# Patient Record
Sex: Female | Born: 1985 | Race: White | Hispanic: No | Marital: Married | Smoking: Never smoker
Health system: Southern US, Community
[De-identification: ages and names within clinical notes are randomized; demographics above are authoritative.]

## PROBLEM LIST (undated history)

## (undated) DIAGNOSIS — O139 Gestational [pregnancy-induced] hypertension without significant proteinuria, unspecified trimester: Secondary | ICD-10-CM

## (undated) DIAGNOSIS — O09299 Supervision of pregnancy with other poor reproductive or obstetric history, unspecified trimester: Secondary | ICD-10-CM

---
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
## 2004-03-16 ENCOUNTER — Other Ambulatory Visit: Admission: RE | Admit: 2004-03-16 | Discharge: 2004-03-16 | Payer: Self-pay | Admitting: Obstetrics and Gynecology

## 2005-03-21 ENCOUNTER — Other Ambulatory Visit: Admission: RE | Admit: 2005-03-21 | Discharge: 2005-03-21 | Payer: Self-pay | Admitting: Obstetrics and Gynecology

## 2005-10-10 HISTORY — PX: WISDOM TOOTH EXTRACTION: SHX21

## 2009-10-10 HISTORY — PX: KNEE SURGERY: SHX244

## 2010-06-16 ENCOUNTER — Ambulatory Visit (HOSPITAL_COMMUNITY)
Admission: RE | Admit: 2010-06-16 | Discharge: 2010-06-16 | Payer: Self-pay | Source: Home / Self Care | Admitting: Obstetrics and Gynecology

## 2010-09-17 ENCOUNTER — Inpatient Hospital Stay (HOSPITAL_COMMUNITY)
Admission: AD | Admit: 2010-09-17 | Discharge: 2010-09-17 | Payer: Self-pay | Source: Home / Self Care | Attending: Obstetrics & Gynecology | Admitting: Obstetrics & Gynecology

## 2010-09-18 ENCOUNTER — Ambulatory Visit (HOSPITAL_COMMUNITY)
Admission: RE | Admit: 2010-09-18 | Discharge: 2010-09-18 | Payer: Self-pay | Source: Home / Self Care | Attending: Obstetrics & Gynecology | Admitting: Obstetrics & Gynecology

## 2010-09-20 ENCOUNTER — Inpatient Hospital Stay (HOSPITAL_COMMUNITY)
Admission: AD | Admit: 2010-09-20 | Discharge: 2010-09-25 | Payer: Self-pay | Source: Home / Self Care | Attending: Obstetrics and Gynecology | Admitting: Obstetrics and Gynecology

## 2010-09-21 ENCOUNTER — Encounter (INDEPENDENT_AMBULATORY_CARE_PROVIDER_SITE_OTHER): Payer: Self-pay | Admitting: Obstetrics and Gynecology

## 2010-09-22 ENCOUNTER — Ambulatory Visit (HOSPITAL_COMMUNITY): Admission: RE | Admit: 2010-09-22 | Payer: Self-pay | Source: Home / Self Care | Admitting: Obstetrics and Gynecology

## 2010-09-25 ENCOUNTER — Encounter
Admission: RE | Admit: 2010-09-25 | Discharge: 2010-10-25 | Payer: Self-pay | Source: Home / Self Care | Attending: Obstetrics and Gynecology | Admitting: Obstetrics and Gynecology

## 2010-09-27 ENCOUNTER — Inpatient Hospital Stay (HOSPITAL_COMMUNITY)
Admission: AD | Admit: 2010-09-27 | Discharge: 2010-09-27 | Payer: Self-pay | Source: Home / Self Care | Attending: Obstetrics and Gynecology | Admitting: Obstetrics and Gynecology

## 2010-09-27 DIAGNOSIS — O139 Gestational [pregnancy-induced] hypertension without significant proteinuria, unspecified trimester: Secondary | ICD-10-CM

## 2010-10-26 ENCOUNTER — Encounter
Admission: RE | Admit: 2010-10-26 | Discharge: 2010-11-09 | Payer: Self-pay | Source: Home / Self Care | Attending: Obstetrics and Gynecology | Admitting: Obstetrics and Gynecology

## 2010-11-10 ENCOUNTER — Inpatient Hospital Stay (HOSPITAL_COMMUNITY): Admission: AD | Admit: 2010-11-10 | Payer: Self-pay | Admitting: Obstetrics and Gynecology

## 2010-12-20 LAB — CBC
HCT: 26.5 % — ABNORMAL LOW (ref 36.0–46.0)
HCT: 27.7 % — ABNORMAL LOW (ref 36.0–46.0)
HCT: 29.4 % — ABNORMAL LOW (ref 36.0–46.0)
Hemoglobin: 10 g/dL — ABNORMAL LOW (ref 12.0–15.0)
Hemoglobin: 9.4 g/dL — ABNORMAL LOW (ref 12.0–15.0)
MCH: 29.7 pg (ref 26.0–34.0)
MCH: 30.1 pg (ref 26.0–34.0)
MCH: 30.2 pg (ref 26.0–34.0)
MCHC: 35 g/dL (ref 30.0–36.0)
MCV: 84.7 fL (ref 78.0–100.0)
MCV: 88 fL (ref 78.0–100.0)
MCV: 88.9 fL (ref 78.0–100.0)
Platelets: 15 10*3/uL — CL (ref 150–400)
Platelets: 22 10*3/uL — CL (ref 150–400)
RBC: 3.32 MIL/uL — ABNORMAL LOW (ref 3.87–5.11)
RDW: 14.1 % (ref 11.5–15.5)
WBC: 14.3 10*3/uL — ABNORMAL HIGH (ref 4.0–10.5)

## 2010-12-20 LAB — COMPREHENSIVE METABOLIC PANEL
AST: 237 U/L — ABNORMAL HIGH (ref 0–37)
AST: 241 U/L — ABNORMAL HIGH (ref 0–37)
AST: 39 U/L — ABNORMAL HIGH (ref 0–37)
Albumin: 1.9 g/dL — ABNORMAL LOW (ref 3.5–5.2)
Albumin: 1.9 g/dL — ABNORMAL LOW (ref 3.5–5.2)
Albumin: 2 g/dL — ABNORMAL LOW (ref 3.5–5.2)
Albumin: 2.7 g/dL — ABNORMAL LOW (ref 3.5–5.2)
Alkaline Phosphatase: 91 U/L (ref 39–117)
BUN: 13 mg/dL (ref 6–23)
BUN: 17 mg/dL (ref 6–23)
CO2: 22 mEq/L (ref 19–32)
CO2: 26 mEq/L (ref 19–32)
Calcium: 6.9 mg/dL — ABNORMAL LOW (ref 8.4–10.5)
Calcium: 8.9 mg/dL (ref 8.4–10.5)
Chloride: 106 mEq/L (ref 96–112)
Chloride: 106 mEq/L (ref 96–112)
Chloride: 108 mEq/L (ref 96–112)
Chloride: 99 mEq/L (ref 96–112)
Creatinine, Ser: 0.88 mg/dL (ref 0.4–1.2)
Creatinine, Ser: 0.89 mg/dL (ref 0.4–1.2)
GFR calc Af Amer: >60 mL/min (ref >60)
GFR calc non Af Amer: >60 mL/min (ref >60)
Glucose, Bld: 90 mg/dL (ref 70–99)
Glucose, Bld: 98 mg/dL (ref 70–99)
Potassium: 4.4 mEq/L (ref 3.5–5.1)
Potassium: 4.5 mEq/L (ref 3.5–5.1)
Potassium: 4.5 mEq/L (ref 3.5–5.1)
Sodium: 136 mEq/L (ref 135–145)
Total Bilirubin: 0.4 mg/dL (ref 0.3–1.2)
Total Bilirubin: 0.9 mg/dL (ref 0.3–1.2)
Total Bilirubin: 1.2 mg/dL (ref 0.3–1.2)
Total Protein: 4.9 g/dL — ABNORMAL LOW (ref 6.0–8.3)
Total Protein: 5.5 g/dL — ABNORMAL LOW (ref 6.0–8.3)

## 2010-12-20 LAB — DIFFERENTIAL
Basophils Absolute: 0 10*3/uL (ref 0.0–0.1)
Basophils Relative: 0 % (ref 0–1)
Eosinophils Absolute: 0.1 10*3/uL (ref 0.0–0.7)
Eosinophils Relative: 1 % (ref 0–5)
Lymphocytes Relative: 13 % (ref 12–46)
Lymphocytes Relative: 9 % — ABNORMAL LOW (ref 12–46)
Monocytes Absolute: 0.5 10*3/uL (ref 0.1–1.0)
Monocytes Relative: 8 % (ref 3–12)
Neutro Abs: 12.4 10*3/uL — ABNORMAL HIGH (ref 1.7–7.7)
Neutro Abs: 12.8 10*3/uL — ABNORMAL HIGH (ref 1.7–7.7)
Neutrophils Relative %: 87 % — ABNORMAL HIGH (ref 43–77)

## 2010-12-20 LAB — URINE MICROSCOPIC-ADD ON

## 2010-12-20 LAB — URINALYSIS, ROUTINE W REFLEX MICROSCOPIC
Bilirubin Urine: NEGATIVE
Glucose, UA: NEGATIVE mg/dL
Urobilinogen, UA: 0.2 mg/dL (ref 0.0–1.0)

## 2010-12-21 LAB — COMPREHENSIVE METABOLIC PANEL
ALT: 16 U/L (ref 0–35)
ALT: 35 U/L (ref 0–35)
AST: 20 U/L (ref 0–37)
AST: 303 U/L — ABNORMAL HIGH (ref 0–37)
AST: 47 U/L — ABNORMAL HIGH (ref 0–37)
Albumin: 2.1 g/dL — ABNORMAL LOW (ref 3.5–5.2)
Albumin: 2.4 g/dL — ABNORMAL LOW (ref 3.5–5.2)
Albumin: 2.4 g/dL — ABNORMAL LOW (ref 3.5–5.2)
Alkaline Phosphatase: 92 U/L (ref 39–117)
Alkaline Phosphatase: 95 U/L (ref 39–117)
BUN: 15 mg/dL (ref 6–23)
CO2: 19 mEq/L (ref 19–32)
CO2: 20 mEq/L (ref 19–32)
CO2: 22 mEq/L (ref 19–32)
Calcium: 8.8 mg/dL (ref 8.4–10.5)
Chloride: 104 mEq/L (ref 96–112)
Chloride: 105 mEq/L (ref 96–112)
Chloride: 111 mEq/L (ref 96–112)
Creatinine, Ser: 0.94 mg/dL (ref 0.4–1.2)
Creatinine, Ser: 1.14 mg/dL (ref 0.4–1.2)
GFR calc Af Amer: >60 mL/min (ref >60)
GFR calc Af Amer: >60 mL/min (ref >60)
GFR calc non Af Amer: 58 mL/min — ABNORMAL LOW (ref >60)
GFR calc non Af Amer: >60 mL/min (ref >60)
GFR calc non Af Amer: >60 mL/min (ref >60)
Glucose, Bld: 95 mg/dL (ref 70–99)
Potassium: 4.1 mEq/L (ref 3.5–5.1)
Potassium: 5.3 mEq/L — ABNORMAL HIGH (ref 3.5–5.1)
Sodium: 134 mEq/L — ABNORMAL LOW (ref 135–145)
Sodium: 137 mEq/L (ref 135–145)
Sodium: 139 mEq/L (ref 135–145)
Total Bilirubin: 1.1 mg/dL (ref 0.3–1.2)
Total Bilirubin: 1.5 mg/dL — ABNORMAL HIGH (ref 0.3–1.2)

## 2010-12-21 LAB — LACTATE DEHYDROGENASE
LDH: 129 U/L (ref 94–250)
LDH: 959 U/L — ABNORMAL HIGH (ref 94–250)

## 2010-12-21 LAB — CBC
Hemoglobin: 11.6 g/dL — ABNORMAL LOW (ref 12.0–15.0)
MCH: 31.5 pg (ref 26.0–34.0)
MCHC: 34.5 g/dL (ref 30.0–36.0)
MCV: 91.3 fL (ref 78.0–100.0)
MCV: 91.9 fL (ref 78.0–100.0)
Platelets: 35 10*3/uL — ABNORMAL LOW (ref 150–400)
Platelets: 70 10*3/uL — ABNORMAL LOW (ref 150–400)
RBC: 3.5 MIL/uL — ABNORMAL LOW (ref 3.87–5.11)
RBC: 3.65 MIL/uL — ABNORMAL LOW (ref 3.87–5.11)
RBC: 4.05 MIL/uL (ref 3.87–5.11)
RDW: 13 % (ref 11.5–15.5)
WBC: 15.2 10*3/uL — ABNORMAL HIGH (ref 4.0–10.5)
WBC: 17.9 10*3/uL — ABNORMAL HIGH (ref 4.0–10.5)

## 2010-12-21 LAB — PROTEIN, URINE, 24 HOUR
Collection Interval-UPROT: 24 hours
Protein, 24H Urine: 17182 mg/d — ABNORMAL HIGH (ref 50–100)
Urine Total Volume-UPROT: 1775 mL

## 2010-12-21 LAB — DIFFERENTIAL
Basophils Absolute: 0 10*3/uL (ref 0.0–0.1)
Basophils Relative: 0 % (ref 0–1)
Eosinophils Relative: 1 % (ref 0–5)
Monocytes Absolute: 1.1 10*3/uL — ABNORMAL HIGH (ref 0.1–1.0)
Monocytes Relative: 8 % (ref 3–12)

## 2010-12-21 LAB — CREATININE CLEARANCE, URINE, 24 HOUR
Creatinine, 24H Ur: 1626 mg/d (ref 700–1800)
Creatinine, Urine: 91.6 mg/dL
Creatinine: 0.94 mg/dL (ref 0.4–1.2)

## 2010-12-21 LAB — URIC ACID: Uric Acid, Serum: 8.6 mg/dL — ABNORMAL HIGH (ref 2.4–7.0)

## 2011-06-29 ENCOUNTER — Other Ambulatory Visit: Payer: Self-pay | Admitting: Obstetrics and Gynecology

## 2012-01-19 IMAGING — CT CT HEAD W/O CM
2 series · 16 of 30 positions shown, 20 images · non-contrast
Comparison: None

CLINICAL DATA: Pregnancy induced hypertension, blurry vision left
eye, post cesarean section

CT HEAD WITHOUT CONTRAST
TECHNIQUE: Contiguous axial images were obtained from the base of
the skull through the vertex without contrast.

[Series 2: routine head w/o · axial · non-contrast · 0.39mm/px · z∈[+886,+1011]mm · 13 of 32 slices shown, 17 images]
[im 3/32  brain]
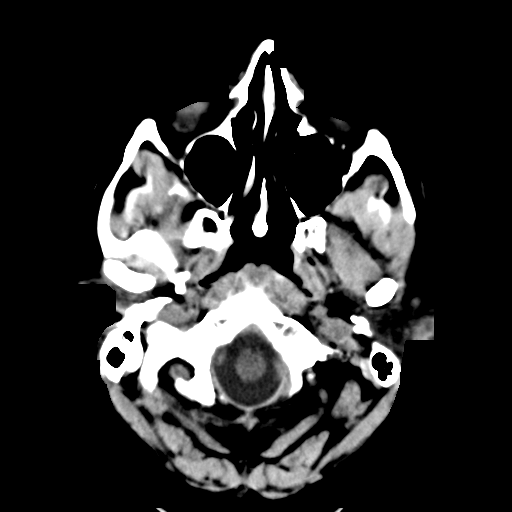
[im 3/32  bone]
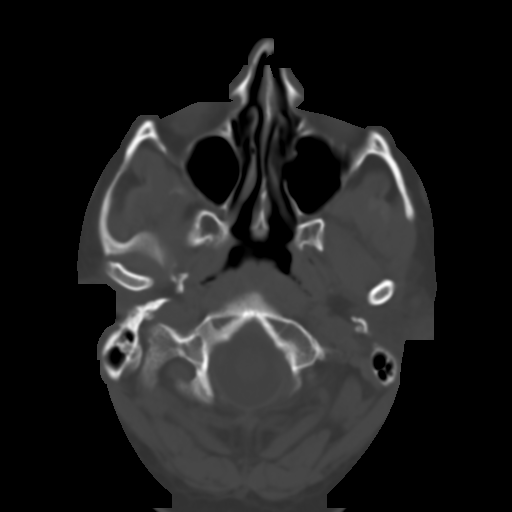
[im 5/32  brain]
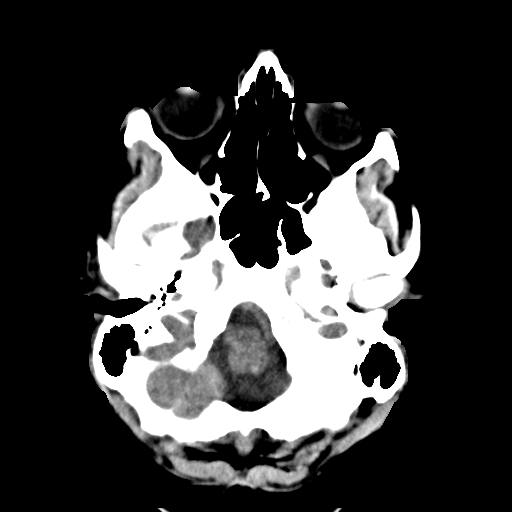
[im 7/32  brain]
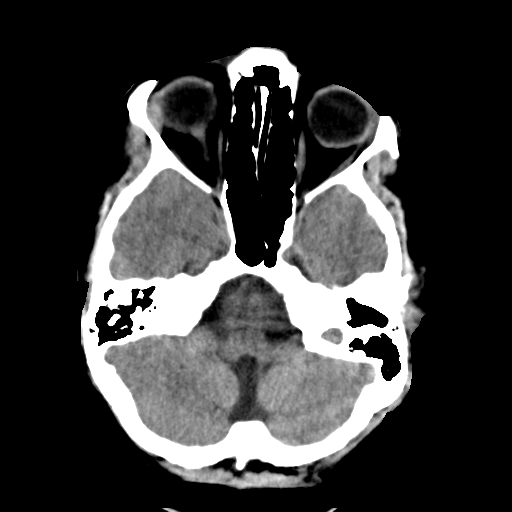
[im 9/32  brain]
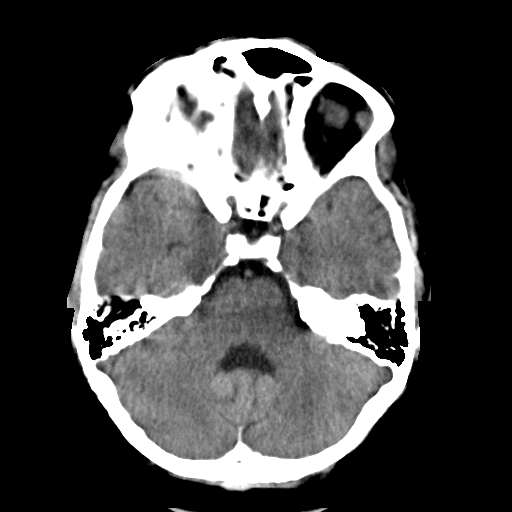
[im 12/32  brain]
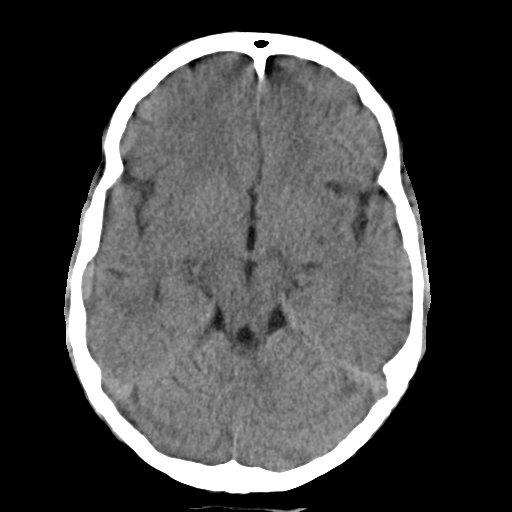
[im 12/32  bone]
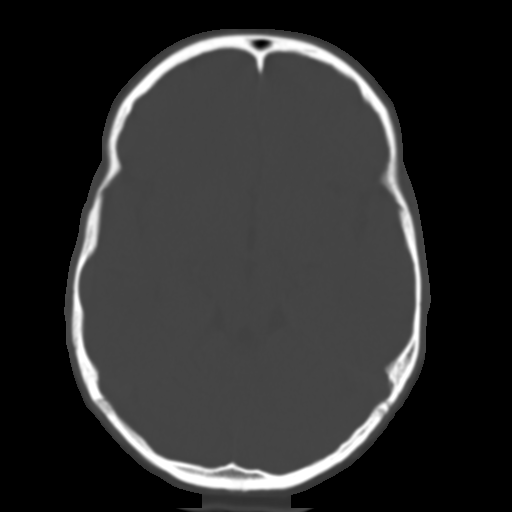
[im 14/32  brain]
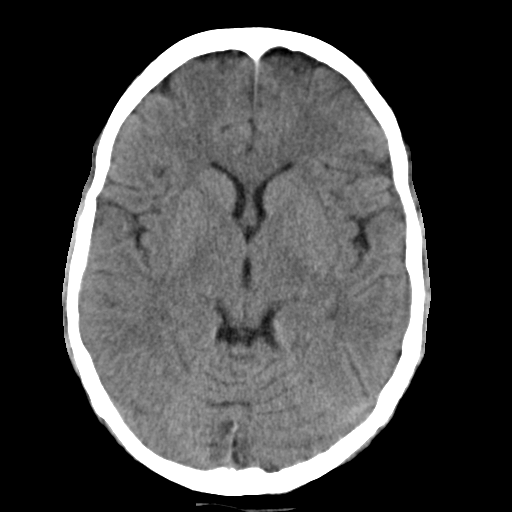
[im 16/32  brain]
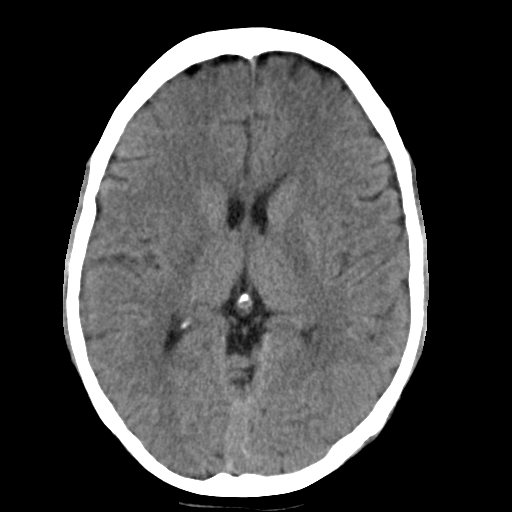
[im 18/32  brain]
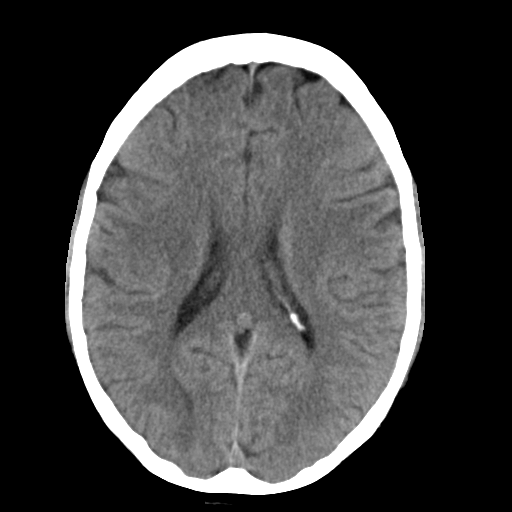
[im 20/32  brain]
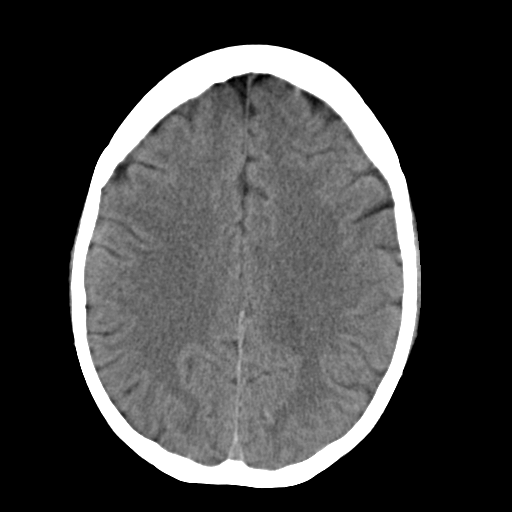
[im 20/32  bone]
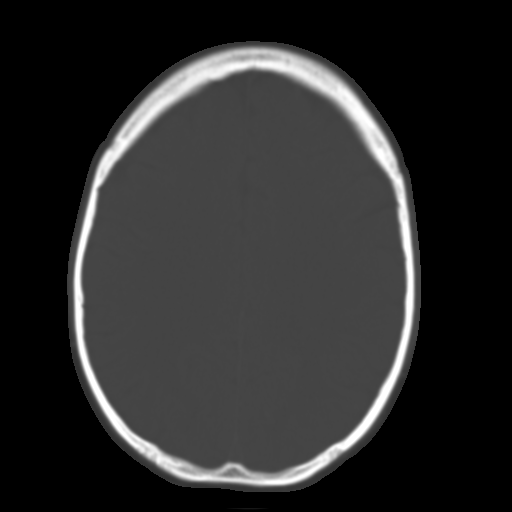
[im 23/32  brain]
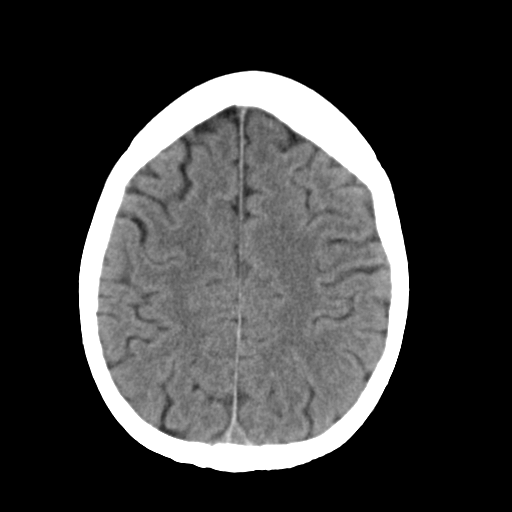
[im 25/32  brain]
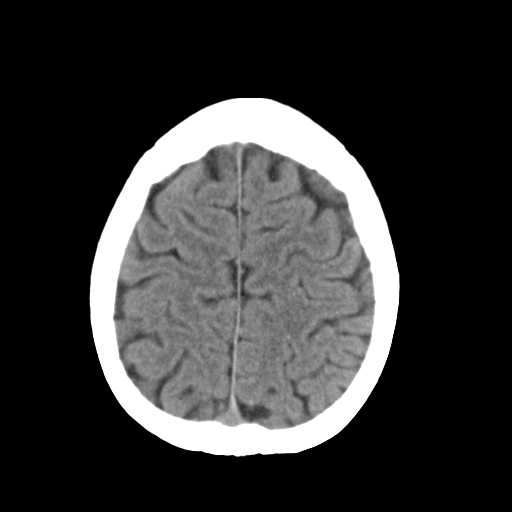
[im 27/32  brain]
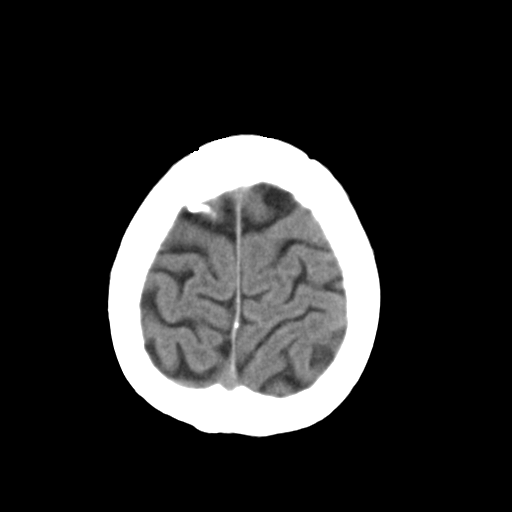
[im 29/32  brain]
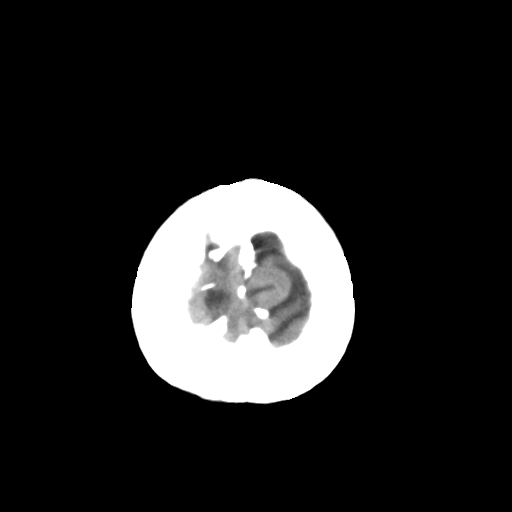
[im 29/32  bone]
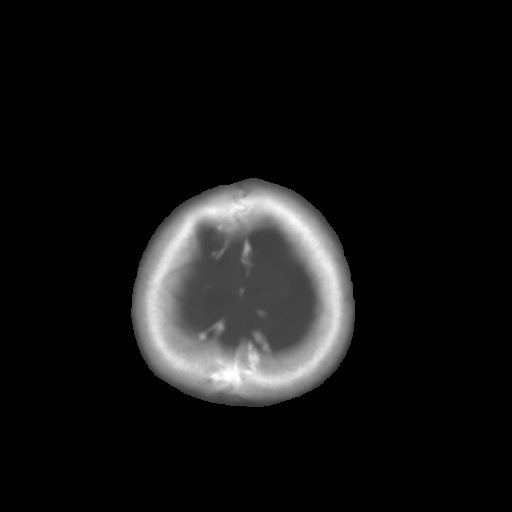

[Series 3: routine head bone · axial · 0.39mm/px · z∈[+886,+930]mm · 3 of 32 slices shown]
[im 3/32  bone]
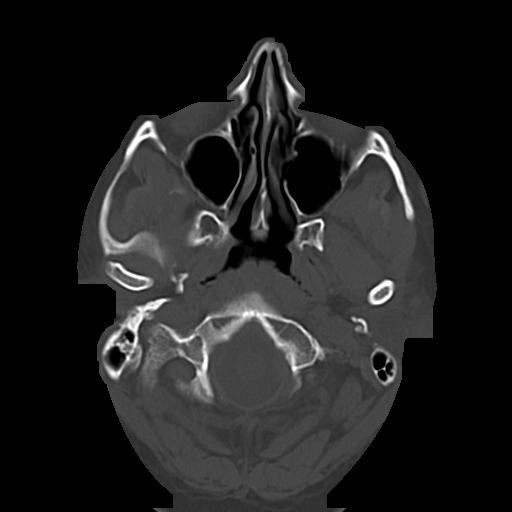
[im 7/32  bone]
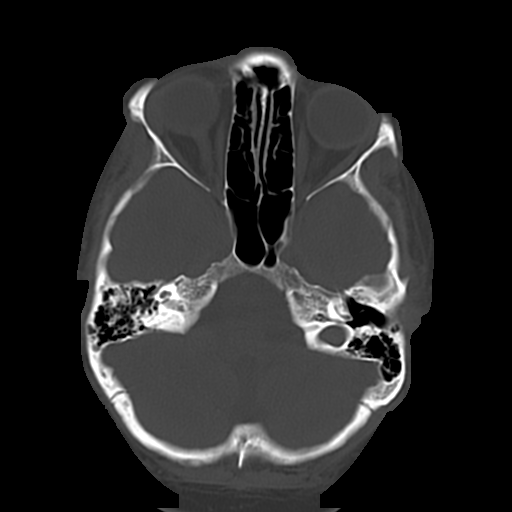
[im 12/32  bone]
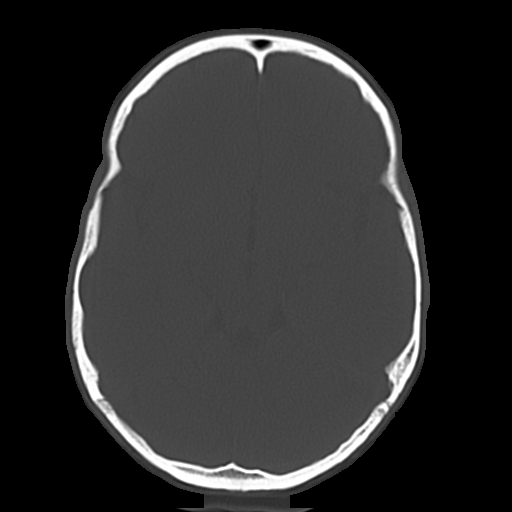

[16 of 30 positions shown; findings below may reference images not displayed]

FINDINGS: Normal ventricular morphology.
No midline shift or mass effect.
Normal appearance of brain parenchyma.
No intracranial hemorrhage, mass lesion, or acute infarction.
Visualized paranasal sinuses and mastoid air cells clear.
Bones unremarkable.
No gross orbital abnormalities.
IMPRESSION: No acute intracranial abnormalities.
If patient has persistent symptoms, recommend follow-up MR imaging
of the brain with and without contrast to further evaluate.

## 2013-01-15 ENCOUNTER — Encounter (HOSPITAL_COMMUNITY): Payer: Self-pay | Admitting: *Deleted

## 2013-01-15 ENCOUNTER — Other Ambulatory Visit: Payer: Self-pay | Admitting: Obstetrics and Gynecology

## 2013-01-15 NOTE — H&P (Signed)
27 y.o. yo G2P1011 with MAB at 11 measuring 8 weeks w/o FHTs.  For d&e.  No past medical history on file.No past surgical history on file.  History   Social History  . Marital Status: Married    Spouse Name: N/A    Number of Children: N/A  . Years of Education: N/A   Occupational History  . Not on file.   Social History Main Topics  . Smoking status: Not on file  . Smokeless tobacco: Not on file  . Alcohol Use: Not on file  . Drug Use: Not on file  . Sexually Active: Not on file   Other Topics Concern  . Not on file   Social History Narrative  . No narrative on file    No current facility-administered medications on file prior to encounter.   No current outpatient prescriptions on file prior to encounter.    Allergies not on file  @VITALS2 @  Lungs: clear to ascultation Cor:  RRR Abdomen:  soft, nontender, nondistended. Ex:  no cords, erythema Pelvic:  8weeks uterus, closed cervix.  A:  For d&e for MAB.     P: All risks, benefits and alternatives d/w patient and she desires to proceed.  Blood type previously checked and is A+   Faizah Kandler A

## 2013-01-16 ENCOUNTER — Ambulatory Visit (HOSPITAL_COMMUNITY): Payer: BC Managed Care – PPO | Admitting: Anesthesiology

## 2013-01-16 ENCOUNTER — Encounter (HOSPITAL_COMMUNITY): Payer: Self-pay | Admitting: Anesthesiology

## 2013-01-16 ENCOUNTER — Encounter (HOSPITAL_COMMUNITY): Payer: Self-pay | Admitting: Pharmacy Technician

## 2013-01-16 ENCOUNTER — Encounter (HOSPITAL_COMMUNITY)
Admission: RE | Disposition: A | Discharge status: Home / Self Care | Payer: Self-pay | Source: Ambulatory Visit | Attending: Obstetrics and Gynecology

## 2013-01-16 ENCOUNTER — Ambulatory Visit (HOSPITAL_COMMUNITY)
Admission: RE | Admit: 2013-01-16 | Discharge: 2013-01-16 | Disposition: A | Discharge status: Home / Self Care | Payer: BC Managed Care – PPO | Source: Ambulatory Visit | Attending: Obstetrics and Gynecology | Admitting: Obstetrics and Gynecology

## 2013-01-16 DIAGNOSIS — Z9889 Other specified postprocedural states: Secondary | ICD-10-CM

## 2013-01-16 DIAGNOSIS — O021 Missed abortion: Secondary | ICD-10-CM | POA: Insufficient documentation

## 2013-01-16 HISTORY — PX: DILATION AND EVACUATION: SHX1459

## 2013-01-16 LAB — CBC
HCT: 38.2 % (ref 36.0–46.0)
Hemoglobin: 13.1 g/dL (ref 12.0–15.0)
MCV: 86.4 fL (ref 78.0–100.0)
Platelets: 212 10*3/uL (ref 150–400)
RBC: 4.42 MIL/uL (ref 3.87–5.11)
WBC: 7.2 10*3/uL (ref 4.0–10.5)

## 2013-01-16 SURGERY — DILATION AND EVACUATION, UTERUS
Anesthesia: Monitor Anesthesia Care | Wound class: Clean Contaminated

## 2013-01-16 MED ORDER — FENTANYL CITRATE 0.05 MG/ML IJ SOLN
INTRAMUSCULAR | Status: DC | PRN
  Administered 2013-01-16: 100 ug via INTRAVENOUS

## 2013-01-16 MED ORDER — KETOROLAC TROMETHAMINE 30 MG/ML IJ SOLN
INTRAMUSCULAR | Status: DC | PRN
  Administered 2013-01-16: 30 mg via INTRAVENOUS

## 2013-01-16 MED ORDER — MEPERIDINE HCL 25 MG/ML IJ SOLN: 6.2500 mg | INTRAMUSCULAR | Status: DC | PRN

## 2013-01-16 MED ORDER — ONDANSETRON HCL 4 MG/2ML IJ SOLN
INTRAMUSCULAR | Status: DC | PRN
  Administered 2013-01-16: 4 mg via INTRAVENOUS

## 2013-01-16 MED ORDER — METHYLERGONOVINE MALEATE 0.2 MG/ML IJ SOLN
INTRAMUSCULAR | Status: DC | PRN
  Administered 2013-01-16: 0.2 mg via INTRAMUSCULAR

## 2013-01-16 MED ORDER — DEXAMETHASONE SODIUM PHOSPHATE 10 MG/ML IJ SOLN
INTRAMUSCULAR | Status: DC | PRN
  Administered 2013-01-16: 10 mg via INTRAVENOUS

## 2013-01-16 MED ORDER — MIDAZOLAM HCL 5 MG/5ML IJ SOLN
INTRAMUSCULAR | Status: DC | PRN
  Administered 2013-01-16: 2 mg via INTRAVENOUS

## 2013-01-16 MED ORDER — FENTANYL CITRATE 0.05 MG/ML IJ SOLN: 25.0000 ug | INTRAMUSCULAR | Status: DC | PRN

## 2013-01-16 MED ORDER — LACTATED RINGERS IV SOLN
INTRAVENOUS | Status: DC
  Administered 2013-01-16 (×2): via INTRAVENOUS

## 2013-01-16 MED ORDER — METOCLOPRAMIDE HCL 5 MG/ML IJ SOLN: 10.0000 mg | Freq: Once | INTRAMUSCULAR | Status: DC | PRN

## 2013-01-16 MED ORDER — LIDOCAINE HCL (CARDIAC) 20 MG/ML IV SOLN
INTRAVENOUS | Status: DC | PRN
  Administered 2013-01-16: 50 mg via INTRAVENOUS

## 2013-01-16 MED ORDER — PROPOFOL 10 MG/ML IV EMUL
INTRAVENOUS | Status: DC | PRN
  Administered 2013-01-16: 160 mg via INTRAVENOUS

## 2013-01-16 SURGICAL SUPPLY — 16 items
CATH ROBINSON RED A/P 16FR (CATHETERS) ×2 IMPLANT
CLOTH BEACON ORANGE TIMEOUT ST (SAFETY) ×2 IMPLANT
DECANTER SPIKE VIAL GLASS SM (MISCELLANEOUS) ×2 IMPLANT
GLOVE BIO SURGEON STRL SZ7 (GLOVE) ×2 IMPLANT
GOWN STRL REIN XL XLG (GOWN DISPOSABLE) ×4 IMPLANT
KIT BERKELEY 1ST TRIMESTER 3/8 (MISCELLANEOUS) ×2 IMPLANT
NS IRRIG 1000ML POUR BTL (IV SOLUTION) ×2 IMPLANT
PACK VAGINAL MINOR WOMEN LF (CUSTOM PROCEDURE TRAY) ×2 IMPLANT
PAD OB MATERNITY 4.3X12.25 (PERSONAL CARE ITEMS) ×2 IMPLANT
PAD PREP 24X48 CUFFED NSTRL (MISCELLANEOUS) ×2 IMPLANT
SET BERKELEY SUCTION TUBING (SUCTIONS) ×2 IMPLANT
TOWEL OR 17X24 6PK STRL BLUE (TOWEL DISPOSABLE) ×4 IMPLANT
VACURETTE 10 RIGID CVD (CANNULA) IMPLANT
VACURETTE 7MM CVD STRL WRAP (CANNULA) IMPLANT
VACURETTE 8 RIGID CVD (CANNULA) IMPLANT
VACURETTE 9 RIGID CVD (CANNULA) IMPLANT

## 2013-01-16 NOTE — Anesthesia Postprocedure Evaluation (Signed)
  Anesthesia Post-op Note  Patient: Alexandra May  Procedure(s) Performed: Procedure(s): DILATATION AND EVACUATION (N/A)  Patient Location: PACU  Anesthesia Type:General  Level of Consciousness: awake, alert  and oriented  Airway and Oxygen Therapy: Patient Spontanous Breathing  Post-op Pain: mild  Post-op Assessment: Post-op Vital signs reviewed, Patient's Cardiovascular Status Stable, Respiratory Function Stable, Patent Airway, No signs of Nausea or vomiting and Pain level controlled  Post-op Vital Signs: Reviewed and stable  Complications: No apparent anesthesia complications

## 2013-01-16 NOTE — Progress Notes (Signed)
There has been no change in the patients history, status or exam since the history and physical. The pt has no medical problems. She has had a LTCS and knee surgery in past.  Filed Vitals:   01/16/13 1009  BP: 117/84  Pulse: 81  Temp: 98.2 F (36.8 C)  TempSrc: Oral  Resp: 18  Height: 5\' 7"  (1.702 m)  Weight: 108.863 kg (240 lb)  SpO2: 100%    Lab Results  Component Value Date   WBC 7.2 01/16/2013   HGB 13.1 01/16/2013   HCT 38.2 01/16/2013   MCV 86.4 01/16/2013   PLT 212 01/16/2013    Sonna Lipsky A

## 2013-01-16 NOTE — Op Note (Signed)
01/16/2013  11:03 AM  PATIENT:  Alexandra May  27 y.o. female  PRE-OPERATIVE DIAGNOSIS:  Missed Abortion [id number]  POST-OPERATIVE DIAGNOSIS:  Missed Abortion [id number]  PROCEDURE:  Procedure(s): DILATATION AND EVACUATION (N/A)  SURGEON:  Surgeon(s) and Role:    * Byran Bilotti A Maurissa Ambrose, MD - Primary   ANESTHESIA:   general  EBL:  Total I/O In: 900 [I.V.:900] Out: 100 [Urine:100]   LOCAL MEDICATIONS USED:  NONE  SPECIMEN:  Source of Specimen:  uterine currettings  DISPOSITION OF SPECIMEN:  PATHOLOGY  COUNTS:  YES  TOURNIQUET:  * No tourniquets in log *  DICTATION: .Note written in EPIC  PLAN OF CARE: Discharge to home after PACU  PATIENT DISPOSITION:  PACU - hemodynamically stable.   Delay start of Pharmacological VTE agent (>24hrs) due to surgical blood loss or risk of bleeding: not applicable  Medications: Methergine  Complications: None  Findings:  9 week size uterus to 7 size post procedure.  Good crie was achieved.  After adequate anesthesia was achieved, the patient was prepped and draped in the usual sterile fashion.  The speculum was placed in the vagina and the cervix stabilized with a single-tooth tenaculum.  The cervix was dilated with Pratt dilators and the 9 mm curette was used to remove contents of the uterus.  Alternating sharp curettage with a curette and suction curettage was performed until all contents were removed and good crie was achieved.  All instruments were removed from the vagina.  The patient tolerated the procedure well.    [name] A      

## 2013-01-16 NOTE — Transfer of Care (Signed)
Immediate Anesthesia Transfer of Care Note  Patient: Alexandra May  Procedure(s) Performed: Procedure(s): DILATATION AND EVACUATION (N/A)  Patient Location: PACU  Anesthesia Type:General  Level of Consciousness: awake  Airway & Oxygen Therapy: Patient Spontanous Breathing  Post-op Assessment: Report given to PACU RN  Post vital signs: stable  Filed Vitals:   01/16/13 1009  BP: 117/84  Pulse: 81  Temp: 36.8 C  Resp: 18    Complications: No apparent anesthesia complications

## 2013-01-16 NOTE — Anesthesia Preprocedure Evaluation (Addendum)
Anesthesia Evaluation  Patient identified by MRN, date of birth, ID band Patient awake    Reviewed: Allergy & Precautions, H&P , NPO status , Patient's Chart, lab work & pertinent test results  Airway Mallampati: III TM Distance: >3 FB Neck ROM: full    Dental no notable dental hx. (+) Teeth Intact   Pulmonary neg pulmonary ROS,  breath sounds clear to auscultation  Pulmonary exam normal       Cardiovascular negative cardio ROS  Rhythm:regular Rate:Normal     Neuro/Psych negative neurological ROS  negative psych ROS   GI/Hepatic negative GI ROS, Neg liver ROS,   Endo/Other  Obesity  Renal/GU negative Renal ROS  negative genitourinary   Musculoskeletal negative musculoskeletal ROS (+)   Abdominal Normal abdominal exam  (+)   Peds  Hematology negative hematology ROS (+)   Anesthesia Other Findings   Reproductive/Obstetrics (+) Pregnancy Missed Ab                          Anesthesia Physical Anesthesia Plan  ASA: II  Anesthesia Plan: General   Post-op Pain Management:    Induction:   Airway Management Planned:   Additional Equipment:   Intra-op Plan:   Post-operative Plan:   Informed Consent: I have reviewed the patients History and Physical, chart, labs and discussed the procedure including the risks, benefits and alternatives for the proposed anesthesia with the patient or authorized representative who has indicated his/her understanding and acceptance.   Dental Advisory Given and Dental advisory given  Plan Discussed with: Anesthesiologist and CRNA  Anesthesia Plan Comments: (Surgeon request GA for procedure.)       Anesthesia Quick Evaluation

## 2013-01-16 NOTE — Brief Op Note (Signed)
01/16/2013  11:03 AM  PATIENT:  Alexandra May  27 y.o. female  PRE-OPERATIVE DIAGNOSIS:  Missed Abortion [phone]  POST-OPERATIVE DIAGNOSIS:  Missed Abortion [phone]  PROCEDURE:  Procedure(s): DILATATION AND EVACUATION (N/May)  SURGEON:  Surgeon(s) and Role:    * Loney Laurence, MD - Primary   ANESTHESIA:   general  EBL:  Total I/O In: 900 [I.V.:900] Out: 100 [Urine:100]   LOCAL MEDICATIONS USED:  NONE  SPECIMEN:  Source of Specimen:  uterine currettings  DISPOSITION OF SPECIMEN:  PATHOLOGY  COUNTS:  YES  TOURNIQUET:  * No tourniquets in log *  DICTATION: .Note written in EPIC  PLAN OF CARE: Discharge to home after PACU  PATIENT DISPOSITION:  PACU - hemodynamically stable.   Delay start of Pharmacological VTE agent (>24hrs) due to surgical blood loss or risk of bleeding: not applicable  Medications: Methergine  Complications: None  Findings:  9 week size uterus to 7 size post procedure.  Good crie was achieved.  After adequate anesthesia was achieved, the patient was prepped and draped in the usual sterile fashion.  The speculum was placed in the vagina and the cervix stabilized with May single-tooth tenaculum.  The cervix was dilated with Shawnie Pons dilators and the 9 mm curette was used to remove contents of the uterus.  Alternating sharp curettage with May curette and suction curettage was performed until all contents were removed and good crie was achieved.  All instruments were removed from the vagina.  The patient tolerated the procedure well.    Alexandra May

## 2013-01-17 ENCOUNTER — Encounter (HOSPITAL_COMMUNITY): Payer: Self-pay | Admitting: Obstetrics and Gynecology

## 2013-10-24 ENCOUNTER — Inpatient Hospital Stay (HOSPITAL_COMMUNITY)
Admission: AD | Admit: 2013-10-24 | Discharge: 2013-10-24 | Disposition: A | Discharge status: Home / Self Care | Payer: BC Managed Care – PPO | Source: Ambulatory Visit | Attending: Obstetrics and Gynecology | Admitting: Obstetrics and Gynecology

## 2013-10-24 ENCOUNTER — Encounter (HOSPITAL_COMMUNITY): Payer: Self-pay | Admitting: *Deleted

## 2013-10-24 DIAGNOSIS — O99891 Other specified diseases and conditions complicating pregnancy: Secondary | ICD-10-CM | POA: Insufficient documentation

## 2013-10-24 DIAGNOSIS — R030 Elevated blood-pressure reading, without diagnosis of hypertension: Secondary | ICD-10-CM | POA: Insufficient documentation

## 2013-10-24 DIAGNOSIS — O9989 Other specified diseases and conditions complicating pregnancy, childbirth and the puerperium: Principal | ICD-10-CM

## 2013-10-24 DIAGNOSIS — O169 Unspecified maternal hypertension, unspecified trimester: Secondary | ICD-10-CM

## 2013-10-24 HISTORY — DX: Supervision of pregnancy with other poor reproductive or obstetric history, unspecified trimester: O09.299

## 2013-10-24 LAB — CBC
HCT: 33 % — ABNORMAL LOW (ref 36.0–46.0)
HEMOGLOBIN: 11.5 g/dL — AB (ref 12.0–15.0)
MCH: 29.6 pg (ref 26.0–34.0)
MCHC: 34.8 g/dL (ref 30.0–36.0)
MCV: 85.1 fL (ref 78.0–100.0)
Platelets: 175 10*3/uL (ref 150–400)
RBC: 3.88 MIL/uL (ref 3.87–5.11)
RDW: 12.7 % (ref 11.5–15.5)
WBC: 12.2 10*3/uL — ABNORMAL HIGH (ref 4.0–10.5)

## 2013-10-24 LAB — URINALYSIS, ROUTINE W REFLEX MICROSCOPIC
Bilirubin Urine: NEGATIVE
Glucose, UA: NEGATIVE mg/dL
HGB URINE DIPSTICK: NEGATIVE
KETONES UR: NEGATIVE mg/dL
Leukocytes, UA: NEGATIVE
Nitrite: NEGATIVE
PROTEIN: NEGATIVE mg/dL
Specific Gravity, Urine: <1.005 — ABNORMAL LOW (ref 1.005–1.030)
UROBILINOGEN UA: 0.2 mg/dL (ref 0.0–1.0)
pH: 6.5 (ref 5.0–8.0)

## 2013-10-24 LAB — COMPREHENSIVE METABOLIC PANEL
ALT: 8 U/L (ref 0–35)
AST: 13 U/L (ref 0–37)
Albumin: 2.8 g/dL — ABNORMAL LOW (ref 3.5–5.2)
Alkaline Phosphatase: 73 U/L (ref 39–117)
BILIRUBIN TOTAL: 0.2 mg/dL — AB (ref 0.3–1.2)
BUN: 12 mg/dL (ref 6–23)
CALCIUM: 9.8 mg/dL (ref 8.4–10.5)
CHLORIDE: 106 meq/L (ref 96–112)
CO2: 22 meq/L (ref 19–32)
CREATININE: 0.76 mg/dL (ref 0.50–1.10)
GLUCOSE: 78 mg/dL (ref 70–99)
Potassium: 4.5 mEq/L (ref 3.7–5.3)
SODIUM: 139 meq/L (ref 137–147)
Total Protein: 6.2 g/dL (ref 6.0–8.3)

## 2013-10-24 LAB — URIC ACID: Uric Acid, Serum: 6.4 mg/dL (ref 2.4–7.0)

## 2013-10-24 NOTE — Discharge Instructions (Signed)
Hypertension During Pregnancy °Hypertension is also called high blood pressure. Blood pressure moves blood in your body. Sometimes, the force that moves the blood becomes too strong. When you are pregnant, this condition should be watched carefully. It can cause problems for you and your baby. °HOME CARE  °· Make and keep all of your doctor visits. °· Take medicine as told by your doctor. Tell your doctor about all medicines you take. °· Eat very little salt. °· Exercise regularly. °· Do not drink alcohol. °· Do not smoke. °· Do not have drinks with caffeine. °· Lie on your left side when resting. °GET HELP RIGHT AWAY IF: °· You have bad belly (abdominal) pain. °· You have sudden puffiness (swelling) in the hands, ankles, or face. °· You gain 4 pounds (1.8 kilograms) or more in 1 week. °· You throw up (vomit) repeatedly. °· You have bleeding from the vagina. °· You do not feel the baby moving as much. °· You have a headache. °· You have blurred or double vision. °· You have muscle twitching or spasms. °· You have shortness of breath. °· You have blue fingernails and lips. °· You have blood in your pee (urine). °MAKE SURE YOU: °· Understand these instructions. °· Will watch your condition. °· Will get help right away if you are not doing well or get worse. °Document Released: 10/29/2010 Document Revised: 07/17/2013 Document Reviewed: 04/25/2013 °[employer]® Patient Information ©2014 [employer]. ° °

## 2013-10-24 NOTE — MAU Note (Signed)
Sent from office, elevated BP (128/90), 2+ protein. Delivered by c/s at 33 wks with prev preg- PIH.  Denies HA, visual changes, epigastric pain or increase in swelling.

## 2013-10-24 NOTE — MAU Provider Note (Signed)
History     CSN: [id number]  Arrival date and time: 10/24/13 1050   First Provider Initiated Contact with Patient 10/24/13 1133      Chief Complaint  Patient presents with  . PIH eval    HPI Alexandra May is a 28 y.o. [phone] at [redacted]w[redacted]d - previous hx of severe Pre-E resulting in PLTCS at 33 weeks - presented to clinic c/o high blood pressures.  Sent to MAU.  Patient has been checking blood pressures BID.  Reports BP was 140/90 this morning at school; 128/82 at clinic.  Denies headache, vision changes, edema, epigastric pain, contractions, leakage of fluids, vaginal bleeding, cough, chest pain, SOB, fever.  Reports good fetal movement.  Not currently on meds for BP.  During first pregnancy, patient was treated with Magnesium during hospital and put on labetalol for two weeks pp.    Past Medical History  Diagnosis Date  . Pregnancy induced hypertension    Past Surgical History  Procedure Laterality Date  . Dilation and evacuation N/A 01/16/2013    Procedure: DILATATION AND EVACUATION;  Surgeon: Loney Laurence, MD;  Location: [facility];  Service: Gynecology;  Laterality: N/A;  . Cesarean section    . Knee surgery  2011  . Wisdom tooth extraction  2007   History reviewed. No pertinent family history.  History  Substance Use Topics  . Smoking status: Never Smoker   . Smokeless tobacco: Not on file  . Alcohol Use: No   Allergies:  Allergies  Allergen Reactions  . Penicillins     Childhood reaction  . Adhesive [Tape] Rash    Prescriptions prior to admission  Medication Sig Dispense Refill  . Prenatal Vit-Fe Fumarate-FA (PRENATAL MULTIVITAMIN) TABS Take 1 tablet by mouth daily at 12 noon.        Review of Systems  Constitutional: Negative for fever.  Eyes: Negative for blurred vision.  Respiratory: Negative for cough and shortness of breath.   Cardiovascular: Negative for chest pain, palpitations and leg swelling.  Gastrointestinal: Negative for abdominal pain.   Genitourinary:       Denies vaginal bleeding or leakage of fluids.  Skin: Negative for rash.  Neurological: Negative for headaches.   Physical Exam   Blood pressure 135/85, pulse 76, temperature 98.4 F (36.9 C), temperature source Oral, resp. rate 20, height 5\' 7"  (1.702 m), weight 119.296 kg (263 lb), SpO2 100.00%. Filed Vitals:   10/24/13 1145 10/24/13 1200 10/24/13 1215 10/24/13 1230  BP: 129/85 108/65 128/77 133/85  Pulse: 70 65 65 72  Temp:      TempSrc:      Resp:      Height:      Weight:      SpO2:        Physical Exam  Constitutional: She is oriented to person, place, and time. She appears well-developed and well-nourished. No distress.  HENT:  Head: Normocephalic and atraumatic.  Eyes: EOM are normal. Pupils are equal, round, and reactive to light.  Cardiovascular: Normal rate, regular rhythm, normal heart sounds and intact distal pulses.   Respiratory: Effort normal and breath sounds normal. No respiratory distress.  GI: There is no tenderness. There is no rebound and no guarding.  gravid  Neurological: She is alert and oriented to person, place, and time.  Good MSK strength   Skin: Skin is warm and dry. She is not diaphoretic.  Psychiatric: She has a normal mood and affect. Her behavior is normal.  Patient appears worried and is  tearful.    MAU Course  Procedures Results for orders placed during the hospital encounter of 10/24/13 (from the past 24 hour(s))  URINALYSIS, ROUTINE W REFLEX MICROSCOPIC     Status: Abnormal   Collection Time    10/24/13 11:05 AM      Result Value Range   Color, Urine YELLOW  YELLOW   APPearance CLEAR  CLEAR   Specific Gravity, Urine <1.005 (*) 1.005 - 1.030   pH 6.5  5.0 - 8.0   Glucose, UA NEGATIVE  NEGATIVE mg/dL   Hgb urine dipstick NEGATIVE  NEGATIVE   Bilirubin Urine NEGATIVE  NEGATIVE   Ketones, ur NEGATIVE  NEGATIVE mg/dL   Protein, ur NEGATIVE  NEGATIVE mg/dL   Urobilinogen, UA 0.2  0.0 - 1.0 mg/dL   Nitrite  NEGATIVE  NEGATIVE   Leukocytes, UA NEGATIVE  NEGATIVE  CBC     Status: Abnormal   Collection Time    10/24/13 11:40 AM      Result Value Range   WBC 12.2 (*) 4.0 - 10.5 K/uL   RBC 3.88  3.87 - 5.11 MIL/uL   Hemoglobin 11.5 (*) 12.0 - 15.0 g/dL   HCT 85.433.0 (*) 62.736.0 - 03.546.0 %   MCV 85.1  78.0 - 100.0 fL   MCH 29.6  26.0 - 34.0 pg   MCHC 34.8  30.0 - 36.0 g/dL   RDW 00.912.7  38.111.5 - 82.915.5 %   Platelets 175  150 - 400 K/uL  COMPREHENSIVE METABOLIC PANEL     Status: Abnormal (Preliminary result)   Collection Time    10/24/13 11:40 AM      Result Value Range   Sodium 139  137 - 147 mEq/L   Potassium 4.5  3.7 - 5.3 mEq/L   Chloride 106  96 - 112 mEq/L   CO2 22  19 - 32 mEq/L   Glucose, Bld 78  70 - 99 mg/dL   BUN 12  6 - 23 mg/dL   Creatinine, Ser 9.370.76  0.50 - 1.10 mg/dL   Calcium 9.8  8.4 - 16.910.5 mg/dL   Total Protein 6.2  6.0 - 8.3 g/dL   Albumin 2.8 (*) 3.5 - 5.2 g/dL   AST 13  0 - 37 U/L   ALT 8  0 - 35 U/L   Alkaline Phosphatase 73  39 - 117 U/L   Total Bilirubin PENDING  0.3 - 1.2 mg/dL   GFR calc non Af Amer >90  >90 mL/min   GFR calc Af Amer >90  >90 mL/min  URIC ACID     Status: None   Collection Time    10/24/13 11:40 AM      Result Value Range   Uric Acid, Serum 6.4  2.4 - 7.0 mg/dL   [id number] Consulted with Dr. Dareen PianoAnderson > reviewed labs/vitals/tracing > discharge to home with follow-up in office next week; may be off work today, but may return if normal blood pressures tomorrow.   Assessment and Plan  28 yo G3P0111 at 2780w0d wks IUP Hx of Pre-E - BP - Normotensive  Category I FHR Tracing  Plan: Discharge to home Off work today; may return in AM Follow-up in office next week Reviewed preX symptoms.  TUCKER, BRITTON L 10/24/2013, 11:36 AM

## 2013-10-29 ENCOUNTER — Encounter (HOSPITAL_COMMUNITY): Payer: Self-pay | Admitting: *Deleted

## 2013-10-29 ENCOUNTER — Inpatient Hospital Stay (HOSPITAL_COMMUNITY)
Admission: AD | Admit: 2013-10-29 | Discharge: 2013-10-29 | Disposition: A | Discharge status: Home / Self Care | Payer: BC Managed Care – PPO | Source: Ambulatory Visit | Attending: Obstetrics and Gynecology | Admitting: Obstetrics and Gynecology

## 2013-10-29 DIAGNOSIS — O34219 Maternal care for unspecified type scar from previous cesarean delivery: Secondary | ICD-10-CM | POA: Insufficient documentation

## 2013-10-29 DIAGNOSIS — O163 Unspecified maternal hypertension, third trimester: Secondary | ICD-10-CM

## 2013-10-29 DIAGNOSIS — O139 Gestational [pregnancy-induced] hypertension without significant proteinuria, unspecified trimester: Secondary | ICD-10-CM | POA: Insufficient documentation

## 2013-10-29 DIAGNOSIS — O169 Unspecified maternal hypertension, unspecified trimester: Secondary | ICD-10-CM

## 2013-10-29 LAB — CBC
HEMATOCRIT: 30.5 % — AB (ref 36.0–46.0)
HEMOGLOBIN: 10.7 g/dL — AB (ref 12.0–15.0)
MCH: 29.8 pg (ref 26.0–34.0)
MCHC: 35.1 g/dL (ref 30.0–36.0)
MCV: 85 fL (ref 78.0–100.0)
Platelets: 146 10*3/uL — ABNORMAL LOW (ref 150–400)
RBC: 3.59 MIL/uL — ABNORMAL LOW (ref 3.87–5.11)
RDW: 12.7 % (ref 11.5–15.5)
WBC: 11.2 10*3/uL — ABNORMAL HIGH (ref 4.0–10.5)

## 2013-10-29 LAB — COMPREHENSIVE METABOLIC PANEL
ALBUMIN: 2.4 g/dL — AB (ref 3.5–5.2)
ALT: 9 U/L (ref 0–35)
AST: 15 U/L (ref 0–37)
Alkaline Phosphatase: 72 U/L (ref 39–117)
BUN: 12 mg/dL (ref 6–23)
CO2: 21 mEq/L (ref 19–32)
CREATININE: 0.77 mg/dL (ref 0.50–1.10)
Calcium: 9 mg/dL (ref 8.4–10.5)
Chloride: 108 mEq/L (ref 96–112)
GFR calc non Af Amer: >90 mL/min (ref >90)
GLUCOSE: 94 mg/dL (ref 70–99)
Potassium: 3.8 mEq/L (ref 3.7–5.3)
Sodium: 140 mEq/L (ref 137–147)
TOTAL PROTEIN: 5.2 g/dL — AB (ref 6.0–8.3)
Total Bilirubin: <0.2 mg/dL — ABNORMAL LOW (ref 0.3–1.2)

## 2013-10-29 LAB — URINALYSIS, ROUTINE W REFLEX MICROSCOPIC
BILIRUBIN URINE: NEGATIVE
GLUCOSE, UA: NEGATIVE mg/dL
KETONES UR: NEGATIVE mg/dL
Leukocytes, UA: NEGATIVE
Nitrite: NEGATIVE
Specific Gravity, Urine: 1.025 (ref 1.005–1.030)
Urobilinogen, UA: 0.2 mg/dL (ref 0.0–1.0)
pH: 6.5 (ref 5.0–8.0)

## 2013-10-29 LAB — URINE MICROSCOPIC-ADD ON

## 2013-10-29 MED ORDER — BETAMETHASONE SOD PHOS & ACET 6 (3-3) MG/ML IJ SUSP
12.0000 mg | Freq: Once | INTRAMUSCULAR | Status: AC
  Administered 2013-10-29: 12 mg via INTRAMUSCULAR
  Filled 2013-10-29: qty 2

## 2013-10-29 NOTE — MAU Note (Addendum)
PT  SAYS SHE WAS HERE LAST WEEK FOR HIGH BLOOD PRESSURE.    SHE TAKES IT  AT HOME.   DENIES H/A, NO BLURRED VISION,  NO PROTEIN IN URINE.    NO UC'S.  DENIES HSV AND MRSA.   AT HOME BP-   159/112

## 2013-10-29 NOTE — Discharge Instructions (Signed)
Urine Protein Test This is a test used to detect excessive protein escaping into the urine, to help evaluate and monitor kidney function, and to detect kidney damage. Proteins usually are not found in the urine. The kidneys (two organs found in the back at the bottom of the rib cage) filter the blood, removing wastes and excreting them out of the body in the form of urine. When the kidneys are functioning normally, they retain or reabsorb filtered protein molecules and return them to the blood. However, if the kidneys are damaged, they become less effective at filtering, and detectible amounts of protein begin to find their way into the urine. Often, it is the smaller albumin molecules that are detected first. If the damage continues, the amount of protein in the urine increases, and globulins may also begin to be lost.  Proteinuria (protein in the urine) is frequently seen in chronic diseases, such as diabetes and hypertension, with increasing amounts of protein in the urine reflecting increasing kidney damage. With early kidney damage, the patient is often asymptomatic. As damage progresses or if protein loss is severe, the patient may have symptoms such as edema (swelling and fluid retention), shortness of breath, nausea, and fatigue. Excess protein production, such as may be seen with multiple myeloma, can also lead to proteinuria. PREPARATION FOR TEST A random urine sample is collected in a clean container. For a 24-hour urine collection, all of the urine is collected for a 24-hour period. It is important that the sample be refrigerated during this time period. There should be no preservative in the container. NORMAL FINDINGS  0-8 mg/dL  50-80 mg/24 hr (at rest)  Less than 250 mg/24 hr (during exercise) Ranges for normal findings may vary among different laboratories and hospitals. You should always check with your doctor after having lab work or other tests done to discuss the meaning of your test  results and whether your values are considered within normal limits. MEANING OF TEST  Your caregiver will go over the test results with you and discuss the importance and meaning of your results, as well as treatment options and the need for additional tests if necessary. OBTAINING THE TEST RESULTS It is your responsibility to obtain your test results. Ask the lab or department performing the test when and how you will get your results. Document Released: 10/21/2004 Document Revised: 12/19/2011 Document Reviewed: 09/08/2008 [facility] Patient Information 2014 [place].  Hypertension During Pregnancy Hypertension is also called high blood pressure. It can occur at any time in life and during pregnancy. When you have hypertension, there is extra pressure inside your blood vessels that carry blood from the heart to the rest of your body (arteries). Hypertension during pregnancy can cause problems for you and your baby. Your baby might not weigh as much as it should at birth or might be born early (premature). Very bad cases of hypertension during pregnancy can be life threatening.  Different types of hypertension can occur during pregnancy.   Chronic hypertension. This happens when a woman has hypertension before pregnancy and it continues during pregnancy.  Gestational hypertension. This is when hypertension develops during pregnancy.  Preeclampsia or toxemia of pregnancy. This is a very serious type of hypertension that develops only during pregnancy. It is a disease that affects the whole body (systemic) and can be very dangerous for both mother and baby.  Gestational hypertension and preeclampsia usually go away after your baby is born. Blood pressure generally stabilizes within 6 weeks. Women who  have hypertension during pregnancy have a greater chance of developing hypertension later in life or with future pregnancies. RISK FACTORS Some factors make you more likely to develop hypertension  during pregnancy. Risk factors include:  Having hypertension before pregnancy.  Having hypertension during a previous pregnancy.  Being overweight.  Being older than 69.  Being pregnant with more than one baby (multiples).  Having diabetes or kidney problems. SIGNS AND SYMPTOMS Chronic and gestational hypertension rarely cause symptoms. Preeclampsia has symptoms, which may include:  Increased protein in your urine. Your health care provider will check for this at every prenatal visit.  Swelling of your hands and face.  Rapid weight gain.  Headaches.  Visual changes.  Being bothered by light.  Abdominal pain, especially in the right upper area.  Chest pain.  Shortness of breath.  Increased reflexes.  Seizures. Seizures occur with a more severe form of preeclampsia, called eclampsia. DIAGNOSIS   You may be diagnosed with hypertension during a regular prenatal exam. At each visit, tests may include:  Blood pressure checks.  A urine test to check for protein in your urine.  The type of hypertension you are diagnosed with depends on when you developed it. It also depends on your specific blood pressure reading.  Developing hypertension before 20 weeks of pregnancy is consistent with chronic hypertension.  Developing hypertension after 20 weeks of pregnancy is consistent with gestational hypertension.  Hypertension with increased urinary protein is diagnosed as preeclampsia.  Blood pressure measurements that stay above 749 systolic or 449 diastolic are a sign of severe preeclampsia. TREATMENT Treatment for hypertension during pregnancy varies. Treatment depends on the type of hypertension and how serious it is.  If you take medicine for chronic hypertension, you may need to switch medicines.  Drugs called ACE inhibitors should not be taken during pregnancy.  Low-dose aspirin may be suggested for women who have risk factors for preeclampsia.  If you have  gestational hypertension, you may need to take a blood pressure medicine that is safe during pregnancy. Your health care provider will recommend the appropriate medicine.  If you have severe preeclampsia, you may need to be in the hospital. Health care providers will watch you and the baby very closely. You also may need to take medicine (magnesium sulfate) to prevent seizures and lower blood pressure.  Sometimes an early delivery is needed. This may be the case if the condition worsens. It would be done to protect you and the baby. The only cure for preeclampsia is delivery. HOME CARE INSTRUCTIONS  Schedule and keep all of your regular appointments for prenatal care.  Only take over-the-counter or prescription medicines as directed by your health care provider. Tell your health care provider about all medicines you take.  Eat as little salt as possible.  Get regular exercise.  Do not drink alcohol.  Do not use tobacco products.  Do not drink products with caffeine.  Lie on your left side when resting. SEEK IMMEDIATE MEDICAL CARE IF:  You have severe abdominal pain.  You have sudden swelling in the hands, ankles, or face.  You gain 4 pounds (1.8 kg) or more in 1 week.  You vomit repeatedly.  You have vaginal bleeding.  You do not feel the baby moving as much.  You have a headache.  You have blurred or double vision.  You have muscle twitching or spasms.  You have shortness of breath.  You have blue fingernails and lips.  You have blood in your urine.  MAKE SURE YOU:  Understand these instructions.  Will watch your condition.  Will get help right away if you are not doing well or get worse. Document Released: 06/14/2011 Document Revised: 07/17/2013 Document Reviewed: 04/25/2013 [facility] Patient Information 2014 [place], [place].

## 2013-10-29 NOTE — MAU Provider Note (Signed)
Chief Complaint:  No chief complaint on file.  First Provider Initiated Contact with Patient 10/29/13 2139     HPI: Alexandra May is a 28 y.o. [id number] at 6849w5d who presents to maternity admissions reporting pressure 159/112 at home. Being followed for elevated blood pressures this pregnancy. History of severe preeclampsia versus HELLP syndrome with previous pregnancy leading to 33 week C-section. Denies headache, vision changes, epigastric pain, contractions, leakage of fluid or vaginal bleeding. Good fetal movement. Reports significant increase in pedal edema of the past 24 hours. Not on any blood pressure medications.  Pregnancy Course: Complicated by gestational hypertension and prior C-section.  Past Medical History: Past Medical History  Diagnosis Date  . H/O pre-eclampsia in prior pregnancy, currently pregnant     Past obstetric history: OB History  Gravida Para Term Preterm AB SAB TAB Ectopic Multiple Living  3 1  1 1 1    1     # Outcome Date GA Lbr Len/2nd Weight Sex Delivery Anes PTL Lv  3 CUR           2 SAB           1 PRE  7460w0d   M LTCS  N Y     Comments: preeclampsia versus HELLP syndrome      Past Surgical History: Past Surgical History  Procedure Laterality Date  . Dilation and evacuation N/A 01/16/2013    Procedure: DILATATION AND EVACUATION;  Surgeon: Loney LaurenceMichelle A Horvath, MD;  Location: [facility];  Service: Gynecology;  Laterality: N/A;  . Cesarean section    . Knee surgery  2011  . Wisdom tooth extraction  2007     Family History: No family history on file.  Social History: History  Substance Use Topics  . Smoking status: Never Smoker   . Smokeless tobacco: Not on file  . Alcohol Use: No    Allergies:  Allergies  Allergen Reactions  . Penicillins     Childhood reaction  . Adhesive [Tape] Rash    Meds:  Prescriptions prior to admission  Medication Sig Dispense Refill  . Prenatal Vit-Fe Fumarate-FA (PRENATAL MULTIVITAMIN) TABS Take 1 tablet by  mouth daily at 12 noon.        ROS: Pertinent findings in history of present illness.  Physical Exam  Blood pressure 146/88, pulse 70, temperature 98.2 F (36.8 C), temperature source Oral, resp. rate 20, height 5\' 7"  (1.702 m), weight 123.152 kg (271 lb 8 oz).  Patient Vitals for the past 24 hrs:  BP Temp Temp src Pulse Resp Height Weight  10/29/13 2321 145/93 mmHg - - 65 18 - -  10/29/13 2233 145/93 mmHg - - 65 - - -  10/29/13 2218 147/88 mmHg - - 67 - - -  10/29/13 2203 149/81 mmHg - - 60 - - -  10/29/13 2148 136/79 mmHg - - 65 - - -  10/29/13 2136 147/88 mmHg - - 76 - - -  10/29/13 2131 150/91 mmHg - - 65 - - -  10/29/13 2106 146/88 mmHg 98.2 F (36.8 C) Oral 70 20 5\' 7"  (1.702 m) 123.152 kg (271 lb 8 oz)    GENERAL: Well-developed, well-nourished female in no acute distress.  HEENT: normocephalic HEART: normal rate RESP: normal effort ABDOMEN: Soft, non-tender, gravid appropriate for gestational age EXTREMITIES: Nontender, 2+ pedal edema NEURO: alert and oriented. Deep tendon reflexes 2+, no clonus. SPECULUM EXAM: Deferred   FHT:  Baseline 140 , moderate variability, accelerations present, no decelerations Contractions: Rare, mild  Labs: Results for orders placed during the hospital encounter of 10/29/13 (from the past 24 hour(s))  URINALYSIS, ROUTINE W REFLEX MICROSCOPIC     Status: Abnormal   Collection Time    10/29/13  9:09 PM      Result Value Range   Color, Urine YELLOW  YELLOW   APPearance CLEAR  CLEAR   Specific Gravity, Urine 1.025  1.005 - 1.030   pH 6.5  5.0 - 8.0   Glucose, UA NEGATIVE  NEGATIVE mg/dL   Hgb urine dipstick TRACE (*) NEGATIVE   Bilirubin Urine NEGATIVE  NEGATIVE   Ketones, ur NEGATIVE  NEGATIVE mg/dL   Protein, ur >086 (*) NEGATIVE mg/dL   Urobilinogen, UA 0.2  0.0 - 1.0 mg/dL   Nitrite NEGATIVE  NEGATIVE   Leukocytes, UA NEGATIVE  NEGATIVE  URINE MICROSCOPIC-ADD ON     Status: None   Collection Time    10/29/13  9:09 PM       Result Value Range   Squamous Epithelial / LPF RARE  RARE   WBC, UA 0-2  <3 WBC/hpf   RBC / HPF 0-2  <3 RBC/hpf   Bacteria, UA RARE  RARE  CBC     Status: Abnormal   Collection Time    10/29/13  9:45 PM      Result Value Range   WBC 11.2 (*) 4.0 - 10.5 K/uL   RBC 3.59 (*) 3.87 - 5.11 MIL/uL   Hemoglobin 10.7 (*) 12.0 - 15.0 g/dL   HCT 57.8 (*) 46.9 - 62.9 %   MCV 85.0  78.0 - 100.0 fL   MCH 29.8  26.0 - 34.0 pg   MCHC 35.1  30.0 - 36.0 g/dL   RDW 52.8  41.3 - 24.4 %   Platelets 146 (*) 150 - 400 K/uL  COMPREHENSIVE METABOLIC PANEL     Status: Abnormal   Collection Time    10/29/13  9:45 PM      Result Value Range   Sodium 140  137 - 147 mEq/L   Potassium 3.8  3.7 - 5.3 mEq/L   Chloride 108  96 - 112 mEq/L   CO2 21  19 - 32 mEq/L   Glucose, Bld 94  70 - 99 mg/dL   BUN 12  6 - 23 mg/dL   Creatinine, Ser 0.10  0.50 - 1.10 mg/dL   Calcium 9.0  8.4 - 27.2 mg/dL   Total Protein 5.2 (*) 6.0 - 8.3 g/dL   Albumin 2.4 (*) 3.5 - 5.2 g/dL   AST 15  0 - 37 U/L   ALT 9  0 - 35 U/L   Alkaline Phosphatase 72  39 - 117 U/L   Total Bilirubin <0.2 (*) 0.3 - 1.2 mg/dL   GFR calc non Af Amer >90  >90 mL/min   GFR calc Af Amer >90  >90 mL/min    Imaging:  No results found.  MAU Course: Dr. Dareen Piano notified of blood pressures and lab results including UA with greater than 300 mg/dL protein. Betamethasone #1 given. Start 24-hour urine.  Assessment: 1.  Gestational hypertension versus preeclampsia in third trimester    Plan: Discharge home in stable condition per consult with Dr. Dareen Piano. Start 24-hour urine. Protein creatinine ratio pending. Preeclampsia and preterm labor precautions and fetal kick counts Work note given.  Follow-up Information   Follow up with [facility] On 10/31/2013. (as scheduled or as needed if symptoms worsen)    Contact information:  [address] [place] [address] [phone]      Follow up  with [facility] On 10/30/2013. (for second steroid shot and to turn in 24 hour urine or sooner as needed if symptoms worsen)    Contact information:   [address] [id number] [place] [address] [phone]       Medication List         prenatal multivitamin Tabs tablet  Take 1 tablet by mouth daily at 12 noon.       [place], CNM 10/29/2013 9:20 PM

## 2013-10-30 ENCOUNTER — Inpatient Hospital Stay (HOSPITAL_COMMUNITY)
Admission: AD | Admit: 2013-10-30 | Discharge: 2013-10-30 | Disposition: A | Discharge status: Home / Self Care | Payer: BC Managed Care – PPO | Source: Ambulatory Visit | Attending: Obstetrics and Gynecology | Admitting: Obstetrics and Gynecology

## 2013-10-30 ENCOUNTER — Encounter (HOSPITAL_COMMUNITY): Payer: Self-pay | Admitting: Advanced Practice Midwife

## 2013-10-30 DIAGNOSIS — IMO0002 Reserved for concepts with insufficient information to code with codable children: Secondary | ICD-10-CM | POA: Insufficient documentation

## 2013-10-30 DIAGNOSIS — O4700 False labor before 37 completed weeks of gestation, unspecified trimester: Secondary | ICD-10-CM | POA: Insufficient documentation

## 2013-10-30 LAB — PROTEIN / CREATININE RATIO, URINE
Creatinine, Urine: 103.68 mg/dL
Protein Creatinine Ratio: 4.91 — ABNORMAL HIGH (ref 0.00–0.15)
Total Protein, Urine: 509.1 mg/dL

## 2013-10-30 MED ORDER — BETAMETHASONE SOD PHOS & ACET 6 (3-3) MG/ML IJ SUSP
12.0000 mg | Freq: Once | INTRAMUSCULAR | Status: AC
  Administered 2013-10-30: 12 mg via INTRAMUSCULAR
  Filled 2013-10-30: qty 2

## 2013-10-30 NOTE — MAU Provider Note (Signed)
History   Chief Complaint:  No chief complaint on file.   Alexandra May is  [age] y.o. [id number] 32.6 who is here for second BMZ injection and to turn in 24 hour urine per Dr. Claiborne Billingsallahan.    Since her last visit, the patient is without new complaint.    Physical Exam   Blood pressure 156/84, pulse 64, resp. rate 18.  FHR 152 by doppler. No NST per Dr. Claiborne Billingsallahan.   Labs: No results found for this or any previous visit (from the past 24 hour(s)).  Assessment: 613w6d weeks gestation Mild pre-eclampsia  Plan: The patient is instructed to [facility] and 24 hour urine results.  Dorathy KinsmanSMITH, Anes Rigel 10/30/2013, 11:17 PM

## 2013-10-31 ENCOUNTER — Inpatient Hospital Stay (HOSPITAL_COMMUNITY): Payer: BC Managed Care – PPO

## 2013-10-31 ENCOUNTER — Telehealth: Payer: Self-pay | Admitting: Advanced Practice Midwife

## 2013-10-31 ENCOUNTER — Inpatient Hospital Stay (HOSPITAL_COMMUNITY)
Admission: AD | Admit: 2013-10-31 | Discharge: 2013-11-05 | DRG: 765 | Disposition: A | Discharge status: Home / Self Care | Payer: BC Managed Care – PPO | Source: Ambulatory Visit | Attending: Obstetrics and Gynecology | Admitting: Obstetrics and Gynecology

## 2013-10-31 ENCOUNTER — Encounter (HOSPITAL_COMMUNITY): Payer: Self-pay | Admitting: *Deleted

## 2013-10-31 DIAGNOSIS — D689 Coagulation defect, unspecified: Secondary | ICD-10-CM | POA: Diagnosis present

## 2013-10-31 DIAGNOSIS — D696 Thrombocytopenia, unspecified: Secondary | ICD-10-CM | POA: Diagnosis present

## 2013-10-31 DIAGNOSIS — O9912 Other diseases of the blood and blood-forming organs and certain disorders involving the immune mechanism complicating childbirth: Secondary | ICD-10-CM

## 2013-10-31 DIAGNOSIS — O34219 Maternal care for unspecified type scar from previous cesarean delivery: Secondary | ICD-10-CM | POA: Diagnosis present

## 2013-10-31 DIAGNOSIS — O1414 Severe pre-eclampsia complicating childbirth: Principal | ICD-10-CM | POA: Diagnosis present

## 2013-10-31 DIAGNOSIS — Z302 Encounter for sterilization: Secondary | ICD-10-CM

## 2013-10-31 DIAGNOSIS — O1490 Unspecified pre-eclampsia, unspecified trimester: Secondary | ICD-10-CM

## 2013-10-31 HISTORY — DX: Gestational (pregnancy-induced) hypertension without significant proteinuria, unspecified trimester: O13.9

## 2013-10-31 LAB — COMPREHENSIVE METABOLIC PANEL
ALK PHOS: 78 U/L (ref 39–117)
ALT: 10 U/L (ref 0–35)
AST: 16 U/L (ref 0–37)
Albumin: 2.6 g/dL — ABNORMAL LOW (ref 3.5–5.2)
BUN: 16 mg/dL (ref 6–23)
CHLORIDE: 106 meq/L (ref 96–112)
CO2: 19 mEq/L (ref 19–32)
Calcium: 9.3 mg/dL (ref 8.4–10.5)
Creatinine, Ser: 0.81 mg/dL (ref 0.50–1.10)
GFR calc Af Amer: >90 mL/min (ref >90)
GFR calc non Af Amer: >90 mL/min (ref >90)
Glucose, Bld: 110 mg/dL — ABNORMAL HIGH (ref 70–99)
POTASSIUM: 4.3 meq/L (ref 3.7–5.3)
Sodium: 140 mEq/L (ref 137–147)
TOTAL PROTEIN: 5.8 g/dL — AB (ref 6.0–8.3)

## 2013-10-31 LAB — CBC
HCT: 32.9 % — ABNORMAL LOW (ref 36.0–46.0)
HEMOGLOBIN: 11.5 g/dL — AB (ref 12.0–15.0)
MCH: 29.9 pg (ref 26.0–34.0)
MCHC: 35 g/dL (ref 30.0–36.0)
MCV: 85.5 fL (ref 78.0–100.0)
Platelets: 162 10*3/uL (ref 150–400)
RBC: 3.85 MIL/uL — ABNORMAL LOW (ref 3.87–5.11)
RDW: 13 % (ref 11.5–15.5)
WBC: 16.1 10*3/uL — ABNORMAL HIGH (ref 4.0–10.5)

## 2013-10-31 LAB — PROTEIN, URINE, 24 HOUR
COLLECTION INTERVAL-UPROT: 24 h
PROTEIN 24H UR: 9214 mg/d — AB (ref 50–100)
Protein, Urine: 1053 mg/dL
URINE TOTAL VOLUME-UPROT: 875 mL

## 2013-10-31 LAB — GROUP B STREP BY PCR: GROUP B STREP BY PCR: NEGATIVE

## 2013-10-31 LAB — URIC ACID: Uric Acid, Serum: 6.9 mg/dL (ref 2.4–7.0)

## 2013-10-31 LAB — ABO/RH: ABO/RH(D): A POS

## 2013-10-31 LAB — LACTATE DEHYDROGENASE: LDH: 159 U/L (ref 94–250)

## 2013-10-31 MED ORDER — CALCIUM CARBONATE ANTACID 500 MG PO CHEW: 2.0000 | CHEWABLE_TABLET | ORAL | Status: DC | PRN

## 2013-10-31 MED ORDER — SODIUM CHLORIDE 0.9 % IJ SOLN
3.0000 mL | INTRAMUSCULAR | Status: DC | PRN
  Administered 2013-11-01: 3 mL via INTRAVENOUS

## 2013-10-31 MED ORDER — SODIUM CHLORIDE 0.9 % IV SOLN: 250.0000 mL | INTRAVENOUS | Status: DC | PRN

## 2013-10-31 MED ORDER — ZOLPIDEM TARTRATE 5 MG PO TABS: 5.0000 mg | ORAL_TABLET | Freq: Every evening | ORAL | Status: DC | PRN

## 2013-10-31 MED ORDER — DOCUSATE SODIUM 100 MG PO CAPS
100.0000 mg | ORAL_CAPSULE | Freq: Every day | ORAL | Status: DC
  Administered 2013-10-31 – 2013-11-01 (×2): 100 mg via ORAL
  Filled 2013-10-31 (×2): qty 1

## 2013-10-31 MED ORDER — ACETAMINOPHEN 325 MG PO TABS
650.0000 mg | ORAL_TABLET | ORAL | Status: DC | PRN
  Administered 2013-11-02 (×2): 650 mg via ORAL
  Filled 2013-10-31 (×2): qty 2

## 2013-10-31 MED ORDER — PRENATAL MULTIVITAMIN CH
1.0000 | ORAL_TABLET | Freq: Every day | ORAL | Status: DC
  Administered 2013-10-31 – 2013-11-01 (×2): 1 via ORAL
  Filled 2013-10-31 (×2): qty 1

## 2013-10-31 MED ORDER — SODIUM CHLORIDE 0.9 % IJ SOLN
3.0000 mL | Freq: Two times a day (BID) | INTRAMUSCULAR | Status: DC
  Administered 2013-10-31 – 2013-11-01 (×2): 3 mL via INTRAVENOUS

## 2013-10-31 NOTE — Consult Note (Signed)
Filed Vitals:   10/31/13 0901 10/31/13 1158 10/31/13 1615  BP: 149/89 136/87 148/88  Pulse: 79 85 64  Temp:  97.4 F (36.3 C) 98 F (36.7 C)  TempSrc:  Oral Oral  Resp: 18 18 18   Height: 5\' 7"  (1.702 m)    Weight: 123.152 kg (271 lb 8 oz)     Pt stable.  Seen by MFM but no note yet.  Continue current.

## 2013-10-31 NOTE — H&P (Signed)
28 y.o. [redacted]w[redacted]d [id number] in for Preeclampsia preterm.  Otherwise has good fetal movement and no bleeding.  Pt denies symptoms of severe preeclampsia such as HA or blurry vision.  No N/V.  Past Medical History  Diagnosis Date  . H/O pre-eclampsia in prior pregnancy, currently pregnant   . Pregnancy induced hypertension   . Preterm labor     Past Surgical History  Procedure Laterality Date  . Dilation and evacuation Alexandra May 01/16/2013    Procedure: DILATATION AND EVACUATION;  Surgeon: MDaria Pastures MD;  [facility];  Service: Gynecology;  Laterality: Alexandra May;  . Cesarean section    . Knee surgery  2011  . Wisdom tooth extraction  2007    OB History  Gravida Para Term Preterm AB SAB TAB Ectopic Multiple Living  '3 1  1 1 1    1    ' # Outcome Date GA Lbr Len/2nd Weight Sex Delivery Anes PTL Lv  3 CUR           2 SAB           1 PRE  328w0d M LTCS  N Y     Comments: preeclampsia versus HELLP syndrome      History   Social History  . Marital Status: Married    Spouse Name: Alexandra May    Number of Children: Alexandra May  . Years of Education: Alexandra May   Occupational History  . Not on file.   Social History Main Topics  . Smoking status: Never Smoker   . Smokeless tobacco: Not on file  . Alcohol Use: No  . Drug Use: No  . Sexual Activity: Yes    Birth Control/ Protection: None   Other Topics Concern  . Not on file   Social History Narrative  . No narrative on file   Penicillins and Adhesive    Prenatal Transfer Tool  Maternal Diabetes: No Genetic Screening: Normal Maternal Ultrasounds/Referrals: Normal Fetal Ultrasounds or other Referrals:  None Maternal Substance Abuse:  No Significant Maternal Medications:  None Significant Maternal Lab Results: None  Other PNC: BPs have been mildly elevated on May couple of office visits but not meeting criteria for PIH.  On 1-15 her urine was 2+ protein [facility] home.  She was seen on the 20th and given  BMZ.  She got the second shot yesterday and dropped off May 24 hour urine; it came back with 9 grams of protein on it today.    Filed Vitals:   10/31/13 0901  BP: 149/89  Pulse: 79  Resp: 18     Lungs/Cor:  NAD Abdomen:  soft, gravid Ex:  no cords, erythema SVE:  deferred FHTs:  120, good STV, NST R Toco:  qocc  Recent Results (from the past 2160 hour(s))  URINALYSIS, ROUTINE W REFLEX MICROSCOPIC     Status: Abnormal   Collection Time    10/24/13 11:05 AM      Result Value Range   Color, Urine YELLOW  YELLOW   APPearance CLEAR  CLEAR   Specific Gravity, Urine <1.005 (*) 1.005 - 1.030   pH 6.5  5.0 - 8.0   Glucose, UA NEGATIVE  NEGATIVE mg/dL   Hgb urine dipstick NEGATIVE  NEGATIVE   Bilirubin Urine NEGATIVE  NEGATIVE   Ketones, ur NEGATIVE  NEGATIVE mg/dL   Protein, ur NEGATIVE  NEGATIVE mg/dL   Urobilinogen, UA 0.2  0.0 - 1.0 mg/dL  Nitrite NEGATIVE  NEGATIVE   Leukocytes, UA NEGATIVE  NEGATIVE   Comment: MICROSCOPIC NOT DONE ON URINES WITH NEGATIVE PROTEIN, BLOOD, LEUKOCYTES, NITRITE, OR GLUCOSE <1000 mg/dL.  CBC     Status: Abnormal   Collection Time    10/24/13 11:40 AM      Result Value Range   WBC 12.2 (*) 4.0 - 10.5 K/uL   RBC 3.88  3.87 - 5.11 MIL/uL   Hemoglobin 11.5 (*) 12.0 - 15.0 g/dL   HCT 33.0 (*) 36.0 - 46.0 %   MCV 85.1  78.0 - 100.0 fL   MCH 29.6  26.0 - 34.0 pg   MCHC 34.8  30.0 - 36.0 g/dL   RDW 12.7  11.5 - 15.5 %   Platelets 175  150 - 400 K/uL  COMPREHENSIVE METABOLIC PANEL     Status: Abnormal   Collection Time    10/24/13 11:40 AM      Result Value Range   Sodium 139  137 - 147 mEq/L   Potassium 4.5  3.7 - 5.3 mEq/L   Chloride 106  96 - 112 mEq/L   CO2 22  19 - 32 mEq/L   Glucose, Bld 78  70 - 99 mg/dL   BUN 12  6 - 23 mg/dL   Creatinine, Ser 0.76  0.50 - 1.10 mg/dL   Calcium 9.8  8.4 - 10.5 mg/dL   Total Protein 6.2  6.0 - 8.3 g/dL   Albumin 2.8 (*) 3.5 - 5.2 g/dL   AST 13  0 - 37 U/L   ALT 8  0 - 35 U/L   Alkaline Phosphatase  73  39 - 117 U/L   Total Bilirubin 0.2 (*) 0.3 - 1.2 mg/dL   Comment: REPEATED TO VERIFY   GFR calc non Af Amer >90  >90 mL/min   GFR calc Af Amer >90  >90 mL/min   Comment: (NOTE)     The eGFR has been calculated using the CKD EPI equation.     This calculation has not been validated in all clinical situations.     eGFR's persistently <90 mL/min signify possible Chronic Kidney     Disease.  URIC ACID     Status: None   Collection Time    10/24/13 11:40 AM      Result Value Range   Uric Acid, Serum 6.4  2.4 - 7.0 mg/dL  URINALYSIS, ROUTINE W REFLEX MICROSCOPIC     Status: Abnormal   Collection Time    10/29/13  9:09 PM      Result Value Range   Color, Urine YELLOW  YELLOW   APPearance CLEAR  CLEAR   Specific Gravity, Urine 1.025  1.005 - 1.030   pH 6.5  5.0 - 8.0   Glucose, UA NEGATIVE  NEGATIVE mg/dL   Hgb urine dipstick TRACE (*) NEGATIVE   Bilirubin Urine NEGATIVE  NEGATIVE   Ketones, ur NEGATIVE  NEGATIVE mg/dL   Protein, ur >300 (*) NEGATIVE mg/dL   Urobilinogen, UA 0.2  0.0 - 1.0 mg/dL   Nitrite NEGATIVE  NEGATIVE   Leukocytes, UA NEGATIVE  NEGATIVE  PROTEIN / CREATININE RATIO, URINE     Status: Abnormal   Collection Time    10/29/13  9:09 PM      Result Value Range   Creatinine, Urine 103.68     Total Protein, Urine 509.1     Comment: NO NORMAL RANGE ESTABLISHED FOR THIS TEST     RESULT CONFIRMED BY AUTOMATED DILUTION  PROTEIN CREATININE RATIO 4.91 (*) 0.00 - 0.15   Comment: Performed at [facility]     Status: None   Collection Time    10/29/13  9:09 PM      Result Value Range   Squamous Epithelial / LPF RARE  RARE   WBC, UA 0-2  <3 WBC/hpf   RBC / HPF 0-2  <3 RBC/hpf   Bacteria, UA RARE  RARE  PROTEIN, URINE, 24 HOUR     Status: Abnormal   Collection Time    10/29/13  9:15 PM      Result Value Range   Urine Total Volume-UPROT 875     Collection Interval-UPROT 24     Protein, Urine 1053     Comment: (NOTE)     Result  repeated and verified.     Result confirmed by automatic dilution.   Protein, 24H Urine 9214 (*) 50 - 100 mg/day   Comment: Performed at [facility]  CBC     Status: Abnormal   Collection Time    10/29/13  9:45 PM      Result Value Range   WBC 11.2 (*) 4.0 - 10.5 K/uL   RBC 3.59 (*) 3.87 - 5.11 MIL/uL   Hemoglobin 10.7 (*) 12.0 - 15.0 g/dL   HCT 30.5 (*) 36.0 - 46.0 %   MCV 85.0  78.0 - 100.0 fL   MCH 29.8  26.0 - 34.0 pg   MCHC 35.1  30.0 - 36.0 g/dL   RDW 12.7  11.5 - 15.5 %   Platelets 146 (*) 150 - 400 K/uL  COMPREHENSIVE METABOLIC PANEL     Status: Abnormal   Collection Time    10/29/13  9:45 PM      Result Value Range   Sodium 140  137 - 147 mEq/L   Potassium 3.8  3.7 - 5.3 mEq/L   Chloride 108  96 - 112 mEq/L   CO2 21  19 - 32 mEq/L   Glucose, Bld 94  70 - 99 mg/dL   BUN 12  6 - 23 mg/dL   Creatinine, Ser 0.77  0.50 - 1.10 mg/dL   Calcium 9.0  8.4 - 10.5 mg/dL   Total Protein 5.2 (*) 6.0 - 8.3 g/dL   Albumin 2.4 (*) 3.5 - 5.2 g/dL   AST 15  0 - 37 U/L   ALT 9  0 - 35 U/L   Alkaline Phosphatase 72  39 - 117 U/L   Total Bilirubin <0.2 (*) 0.3 - 1.2 mg/dL   Comment: REPEATED TO VERIFY   GFR calc non Af Amer >90  >90 mL/min   GFR calc Af Amer >90  >90 mL/min   Comment: (NOTE)     The eGFR has been calculated using the CKD EPI equation.     This calculation has not been validated in all clinical situations.     eGFR's persistently <90 mL/min signify possible Chronic Kidney     Disease.  CBC     Status: Abnormal   Collection Time    10/31/13  9:45 AM      Result Value Range   WBC 16.1 (*) 4.0 - 10.5 K/uL   RBC 3.85 (*) 3.87 - 5.11 MIL/uL   Hemoglobin 11.5 (*) 12.0 - 15.0 g/dL   HCT 32.9 (*) 36.0 - 46.0 %   MCV 85.5  78.0 - 100.0 fL   MCH 29.9  26.0 - 34.0 pg   MCHC 35.0  30.0 - 36.0 g/dL   RDW 13.0  11.5 - 15.5 %   Platelets 162  150 - 400 K/uL   May/P   Severe preeclampsia with stable BPs and no signs of HELLP right now for observation in hospital  until at least 34 weeks.   1.  Pt has received May recent course of BMZ.   2.  Will have pt see MFM for [place] and consult for assistance in delivery planning.  3.  GBS pending.  Repeat labs in am.  4.  Pt desires repeat C/S with BTL for delivery.   Alexandra May

## 2013-10-31 NOTE — Telephone Encounter (Signed)
Pt notified of elevated 24 hour urine 9214 and Dr. Delray Altallahan's recommendation for admission to Antenatal. Pt instructed to stay NPO until talks to her provider. [facility]  [place], [place] 10/31/2013 6:51 AM

## 2013-10-31 NOTE — Progress Notes (Signed)
Patient off the monitor to go to MFM

## 2013-10-31 NOTE — Consult Note (Signed)
MFM Note  [name] is a 28 year old G3P1A1 at 33+[redacted] weeks gestation who was admitted today with preeclampsia.   G1: uncomplicated until 30 weeks; at routine OBV, BPs were up with proteinuria; admitted and quickly developed HELLP syndrome; primary C/S with delivery of a female infant weighing 3+15; she spent several days in the AICU and was discharged on labetalol  G2: Missed AB at 11 weeks; uncomplicated D&C  G3: had been following BPs for a couple of weeks; observed in MAU on 01/15 after BP was 128/90 with 2+ urine protein in office; HELLP labs were normal and discharged home; on 01/20 seen again in MAU for home BP of 159/112; BPs in MAU were 140s-150s/80s-90s with normal labs; received #1 BMZ; dc'ed home with 24 hour urine order; returned to MAU next day for #2 BMZ; 24 hour urine protein returned elevated at ~9 grams today; she was called to be admitted; BPs here 149/89, 163/87 and 144/88; HELLP labs again normal; serum Cr 0.76, 0.77 and 0.81.  US today: singleton pregnancy; normal anatomy (limited heart views); normal AFV; EFW at the 19th %tile; AC < 3rd %tile; normal UA dopplers  Monitor tracing: reactive with rare variables; subtle contractions  Currently is without complaints; denies HA, change in vision, abdominal pain or shortness of breath; reports good fetal movement  Assessment 1) SIUP at 33+0 weeks 2) Preeclampsia without severe features 3) Asymmetric lagging growth 4) S/P course of BMZ 5) H/O severe preeclampsia with HELLP syndrome 6) S/P C/S   Recommendations: 1) Observe in house for few days, probably until delivery; serial labs; no need for another 24 hour urine; daily NSTs 2) Deliver (via repeat C/S with tubal ligation) for any severe features: uncontrolled BPs, HELLP syndrome, persistent severe CNS symptoms, renal insufficiency or pulmonary edema 3) Would treat BP if they become severe: > 160/>110; then attempt to wait until 34 weeks and deliver or if > 34 weeks,  deliver 4) Magnesium sulfate when decision to deliver is made 5) If no severe BPs or features after several days, could consider out pt management (doubt this will happen) 6) Weekly BPPs and UA dopplers 7) Growth US in 3 weeks  Thank you for the kind referral. Please call us with any questions.  (Face-to-face consultation with patient: 45 min)

## 2013-11-01 LAB — COMPREHENSIVE METABOLIC PANEL
ALT: 9 U/L (ref 0–35)
AST: 14 U/L (ref 0–37)
Albumin: 2.1 g/dL — ABNORMAL LOW (ref 3.5–5.2)
Alkaline Phosphatase: 68 U/L (ref 39–117)
BUN: 16 mg/dL (ref 6–23)
CALCIUM: 8.6 mg/dL (ref 8.4–10.5)
CO2: 19 mEq/L (ref 19–32)
Chloride: 109 mEq/L (ref 96–112)
Creatinine, Ser: 0.77 mg/dL (ref 0.50–1.10)
GFR calc non Af Amer: >90 mL/min (ref >90)
Glucose, Bld: 94 mg/dL (ref 70–99)
POTASSIUM: 4.3 meq/L (ref 3.7–5.3)
Sodium: 140 mEq/L (ref 137–147)
TOTAL PROTEIN: 4.8 g/dL — AB (ref 6.0–8.3)
Total Bilirubin: <0.2 mg/dL — ABNORMAL LOW (ref 0.3–1.2)

## 2013-11-01 LAB — CBC
HCT: 29.9 % — ABNORMAL LOW (ref 36.0–46.0)
Hemoglobin: 10.4 g/dL — ABNORMAL LOW (ref 12.0–15.0)
MCH: 30 pg (ref 26.0–34.0)
MCHC: 34.8 g/dL (ref 30.0–36.0)
MCV: 86.2 fL (ref 78.0–100.0)
PLATELETS: 142 10*3/uL — AB (ref 150–400)
RBC: 3.47 MIL/uL — ABNORMAL LOW (ref 3.87–5.11)
RDW: 13.1 % (ref 11.5–15.5)
WBC: 15.1 10*3/uL — ABNORMAL HIGH (ref 4.0–10.5)

## 2013-11-01 LAB — LACTATE DEHYDROGENASE: LDH: 150 U/L (ref 94–250)

## 2013-11-01 LAB — URIC ACID: URIC ACID, SERUM: 6.7 mg/dL (ref 2.4–7.0)

## 2013-11-01 MED ORDER — LABETALOL HCL 5 MG/ML IV SOLN
10.0000 mg | INTRAVENOUS | Status: DC | PRN
  Administered 2013-11-01 – 2013-11-02 (×3): 10 mg via INTRAVENOUS
  Filled 2013-11-01 (×2): qty 4

## 2013-11-01 NOTE — Progress Notes (Signed)
Patient ID: Maisie FusJamie O Kroft, female   DOB: 01/26/1986, 28 y.o.   MRN: [id number]   HD# 2 Pre-eclampsia without severe features  S: Pt feels well except tired.  Denies headaches, vision changes, nausea, epigastric or RUQ pain. Active fetal movement. States she isn't urinating much. Ceasar Mons: Filed Vitals:   11/01/13 0205 11/01/13 0420 11/01/13 0807 11/01/13 0854  BP: 157/86 139/74 165/98   Pulse: 66 68 55   Temp:  98 F (36.7 C) 97.9 F (36.6 C)   TempSrc:  Oral Oral   Resp: 18 18 20    Height:      Weight:    122.154 kg (269 lb 4.8 oz)  SpO2: 99%      AOX3, NAD Abd Soft FHR 150 reactive with moderate variability, + accels, no decels Cvx deferred Toco: quiet  A/P 1) Pre-eclampsia without severe features: S/P BMZ       Continue in-patient management for now       Treat severe range BPS >160/>110       NST Q shift, d/c continuous monitoring       Deliver for severe symptoms       UA Dopplers/AFI Q week if remains undelivered       Deliver at 34 weeks

## 2013-11-01 NOTE — Progress Notes (Signed)
This note also relates to the following rows which could not be included: Pulse Rate - Cannot attach notes to unvalidated device data   Will give labetalol 10mg  IV

## 2013-11-02 ENCOUNTER — Inpatient Hospital Stay (HOSPITAL_COMMUNITY): Payer: BC Managed Care – PPO

## 2013-11-02 ENCOUNTER — Encounter (HOSPITAL_COMMUNITY)
Admission: AD | Disposition: A | Discharge status: Home / Self Care | Payer: Self-pay | Source: Ambulatory Visit | Attending: Obstetrics and Gynecology

## 2013-11-02 ENCOUNTER — Encounter (HOSPITAL_COMMUNITY): Payer: BC Managed Care – PPO

## 2013-11-02 ENCOUNTER — Encounter (HOSPITAL_COMMUNITY): Payer: Self-pay

## 2013-11-02 DIAGNOSIS — O1414 Severe pre-eclampsia complicating childbirth: Secondary | ICD-10-CM | POA: Diagnosis present

## 2013-11-02 LAB — COMPREHENSIVE METABOLIC PANEL
ALBUMIN: 2.1 g/dL — AB (ref 3.5–5.2)
ALT: 10 U/L (ref 0–35)
AST: 18 U/L (ref 0–37)
Alkaline Phosphatase: 67 U/L (ref 39–117)
BUN: 18 mg/dL (ref 6–23)
CHLORIDE: 105 meq/L (ref 96–112)
CO2: 20 mEq/L (ref 19–32)
Calcium: 8.3 mg/dL — ABNORMAL LOW (ref 8.4–10.5)
Creatinine, Ser: 0.81 mg/dL (ref 0.50–1.10)
GFR calc Af Amer: >90 mL/min (ref >90)
GFR calc non Af Amer: >90 mL/min (ref >90)
Glucose, Bld: 78 mg/dL (ref 70–99)
POTASSIUM: 4.1 meq/L (ref 3.7–5.3)
SODIUM: 136 meq/L — AB (ref 137–147)
TOTAL PROTEIN: 5.1 g/dL — AB (ref 6.0–8.3)
Total Bilirubin: <0.2 mg/dL — ABNORMAL LOW (ref 0.3–1.2)

## 2013-11-02 LAB — CBC
HCT: 32 % — ABNORMAL LOW (ref 36.0–46.0)
Hemoglobin: 11 g/dL — ABNORMAL LOW (ref 12.0–15.0)
MCH: 29.7 pg (ref 26.0–34.0)
MCHC: 34.4 g/dL (ref 30.0–36.0)
MCV: 86.5 fL (ref 78.0–100.0)
Platelets: 126 10*3/uL — ABNORMAL LOW (ref 150–400)
RBC: 3.7 MIL/uL — ABNORMAL LOW (ref 3.87–5.11)
RDW: 13 % (ref 11.5–15.5)
WBC: 15.4 10*3/uL — ABNORMAL HIGH (ref 4.0–10.5)

## 2013-11-02 LAB — PREPARE RBC (CROSSMATCH)

## 2013-11-02 SURGERY — Surgical Case
Anesthesia: Spinal

## 2013-11-02 MED ORDER — DIPHENHYDRAMINE HCL 50 MG/ML IJ SOLN: 25.0000 mg | INTRAMUSCULAR | Status: DC | PRN

## 2013-11-02 MED ORDER — BUPIVACAINE IN DEXTROSE 0.75-8.25 % IT SOLN
INTRATHECAL | Status: DC | PRN
  Administered 2013-11-02: 1.5 mL via INTRATHECAL

## 2013-11-02 MED ORDER — CEFOTETAN DISODIUM 2 G IJ SOLR
2.0000 g | INTRAMUSCULAR | Status: AC
  Administered 2013-11-02: 2 g via INTRAVENOUS
  Filled 2013-11-02: qty 2

## 2013-11-02 MED ORDER — LANOLIN HYDROUS EX OINT: 1.0000 "application " | TOPICAL_OINTMENT | CUTANEOUS | Status: DC | PRN

## 2013-11-02 MED ORDER — MAGNESIUM SULFATE 40 G IN LACTATED RINGERS - SIMPLE
2.0000 g/h | INTRAVENOUS | Status: DC
  Administered 2013-11-03: 2 g/h via INTRAVENOUS
  Filled 2013-11-02 (×2): qty 500

## 2013-11-02 MED ORDER — PHENYLEPHRINE 8 MG IN D5W 100 ML (0.08MG/ML) PREMIX OPTIME
INJECTION | INTRAVENOUS | Status: AC
  Filled 2013-11-02: qty 100

## 2013-11-02 MED ORDER — NALBUPHINE HCL 10 MG/ML IJ SOLN
5.0000 mg | INTRAMUSCULAR | Status: DC | PRN
  Filled 2013-11-02: qty 1

## 2013-11-02 MED ORDER — LACTATED RINGERS IV SOLN
INTRAVENOUS | Status: DC
  Administered 2013-11-03: 03:00:00 via INTRAVENOUS

## 2013-11-02 MED ORDER — PHENYLEPHRINE 8 MG IN D5W 100 ML (0.08MG/ML) PREMIX OPTIME
INJECTION | INTRAVENOUS | Status: DC | PRN
  Administered 2013-11-02: 60 ug/min via INTRAVENOUS

## 2013-11-02 MED ORDER — SIMETHICONE 80 MG PO CHEW
80.0000 mg | CHEWABLE_TABLET | ORAL | Status: DC
  Administered 2013-11-03 – 2013-11-04 (×3): 80 mg via ORAL
  Filled 2013-11-02 (×2): qty 1

## 2013-11-02 MED ORDER — MENTHOL 3 MG MT LOZG: 1.0000 | LOZENGE | OROMUCOSAL | Status: DC | PRN

## 2013-11-02 MED ORDER — ONDANSETRON HCL 4 MG/2ML IJ SOLN: 4.0000 mg | Freq: Three times a day (TID) | INTRAMUSCULAR | Status: DC | PRN

## 2013-11-02 MED ORDER — SENNOSIDES-DOCUSATE SODIUM 8.6-50 MG PO TABS
2.0000 | ORAL_TABLET | ORAL | Status: DC
  Administered 2013-11-03 (×2): 2 via ORAL
  Filled 2013-11-02 (×3): qty 2

## 2013-11-02 MED ORDER — NALOXONE HCL 0.4 MG/ML IJ SOLN: 0.4000 mg | INTRAMUSCULAR | Status: DC | PRN

## 2013-11-02 MED ORDER — MAGNESIUM SULFATE BOLUS VIA INFUSION
6.0000 g | Freq: Once | INTRAVENOUS | Status: AC
  Administered 2013-11-02: 6 g via INTRAVENOUS
  Filled 2013-11-02: qty 500

## 2013-11-02 MED ORDER — METOCLOPRAMIDE HCL 5 MG/ML IJ SOLN: 10.0000 mg | Freq: Three times a day (TID) | INTRAMUSCULAR | Status: DC | PRN

## 2013-11-02 MED ORDER — HYDROMORPHONE HCL PF 1 MG/ML IJ SOLN
INTRAMUSCULAR | Status: AC
  Filled 2013-11-02: qty 1

## 2013-11-02 MED ORDER — KETOROLAC TROMETHAMINE 30 MG/ML IJ SOLN: 30.0000 mg | Freq: Four times a day (QID) | INTRAMUSCULAR | Status: AC | PRN

## 2013-11-02 MED ORDER — KETOROLAC TROMETHAMINE 60 MG/2ML IM SOLN: 60.0000 mg | Freq: Once | INTRAMUSCULAR | Status: AC | PRN

## 2013-11-02 MED ORDER — FENTANYL CITRATE 0.05 MG/ML IJ SOLN
INTRAMUSCULAR | Status: DC | PRN
  Administered 2013-11-02: 15 ug via INTRATHECAL

## 2013-11-02 MED ORDER — SIMETHICONE 80 MG PO CHEW
80.0000 mg | CHEWABLE_TABLET | Freq: Three times a day (TID) | ORAL | Status: DC
  Administered 2013-11-03 – 2013-11-05 (×7): 80 mg via ORAL
  Filled 2013-11-02 (×6): qty 1

## 2013-11-02 MED ORDER — DIBUCAINE 1 % RE OINT: 1.0000 "application " | TOPICAL_OINTMENT | RECTAL | Status: DC | PRN

## 2013-11-02 MED ORDER — LACTATED RINGERS IV SOLN
INTRAVENOUS | Status: DC
  Administered 2013-11-02 (×2): via INTRAVENOUS

## 2013-11-02 MED ORDER — ZOLPIDEM TARTRATE 5 MG PO TABS: 5.0000 mg | ORAL_TABLET | Freq: Every evening | ORAL | Status: DC | PRN

## 2013-11-02 MED ORDER — OXYTOCIN 10 UNIT/ML IJ SOLN
40.0000 [IU] | INTRAVENOUS | Status: DC | PRN
  Administered 2013-11-02: 40 [IU] via INTRAVENOUS

## 2013-11-02 MED ORDER — PRENATAL MULTIVITAMIN CH
1.0000 | ORAL_TABLET | Freq: Every day | ORAL | Status: DC
  Administered 2013-11-03 – 2013-11-05 (×3): 1 via ORAL
  Filled 2013-11-02 (×3): qty 1

## 2013-11-02 MED ORDER — MORPHINE SULFATE 0.5 MG/ML IJ SOLN
INTRAMUSCULAR | Status: AC
  Filled 2013-11-02: qty 10

## 2013-11-02 MED ORDER — SIMETHICONE 80 MG PO CHEW
80.0000 mg | CHEWABLE_TABLET | ORAL | Status: DC | PRN
  Filled 2013-11-02: qty 1

## 2013-11-02 MED ORDER — DIPHENHYDRAMINE HCL 50 MG/ML IJ SOLN: 12.5000 mg | INTRAMUSCULAR | Status: DC | PRN

## 2013-11-02 MED ORDER — NALOXONE HCL 1 MG/ML IJ SOLN
1.0000 ug/kg/h | INTRAVENOUS | Status: DC | PRN
  Filled 2013-11-02: qty 2

## 2013-11-02 MED ORDER — PROMETHAZINE HCL 25 MG/ML IJ SOLN
INTRAMUSCULAR | Status: AC
  Administered 2013-11-02: 6.25 mg via INTRAVENOUS
  Filled 2013-11-02: qty 1

## 2013-11-02 MED ORDER — SODIUM CHLORIDE 0.9 % IJ SOLN: 3.0000 mL | INTRAMUSCULAR | Status: DC | PRN

## 2013-11-02 MED ORDER — FENTANYL CITRATE 0.05 MG/ML IJ SOLN
INTRAMUSCULAR | Status: AC
  Filled 2013-11-02: qty 2

## 2013-11-02 MED ORDER — MEPERIDINE HCL 25 MG/ML IJ SOLN: 6.2500 mg | INTRAMUSCULAR | Status: DC | PRN

## 2013-11-02 MED ORDER — CITRIC ACID-SODIUM CITRATE 334-500 MG/5ML PO SOLN
ORAL | Status: AC
  Administered 2013-11-02: 30 mL
  Filled 2013-11-02: qty 15

## 2013-11-02 MED ORDER — DIPHENHYDRAMINE HCL 25 MG PO CAPS: 25.0000 mg | ORAL_CAPSULE | ORAL | Status: DC | PRN

## 2013-11-02 MED ORDER — DIPHENHYDRAMINE HCL 25 MG PO CAPS: 25.0000 mg | ORAL_CAPSULE | Freq: Four times a day (QID) | ORAL | Status: DC | PRN

## 2013-11-02 MED ORDER — ONDANSETRON HCL 4 MG/2ML IJ SOLN: 4.0000 mg | INTRAMUSCULAR | Status: DC | PRN

## 2013-11-02 MED ORDER — TETANUS-DIPHTH-ACELL PERTUSSIS 5-2.5-18.5 LF-MCG/0.5 IM SUSP
0.5000 mL | Freq: Once | INTRAMUSCULAR | Status: AC
  Administered 2013-11-03: 0.5 mL via INTRAMUSCULAR
  Filled 2013-11-02: qty 0.5

## 2013-11-02 MED ORDER — OXYTOCIN 40 UNITS IN LACTATED RINGERS INFUSION - SIMPLE MED: 62.5000 mL/h | INTRAVENOUS | Status: AC

## 2013-11-02 MED ORDER — SCOPOLAMINE 1 MG/3DAYS TD PT72
1.0000 | MEDICATED_PATCH | Freq: Once | TRANSDERMAL | Status: AC
  Administered 2013-11-02: 1.5 mg via TRANSDERMAL

## 2013-11-02 MED ORDER — SCOPOLAMINE 1 MG/3DAYS TD PT72
MEDICATED_PATCH | TRANSDERMAL | Status: AC
  Filled 2013-11-02: qty 1

## 2013-11-02 MED ORDER — OXYCODONE-ACETAMINOPHEN 5-325 MG PO TABS
1.0000 | ORAL_TABLET | ORAL | Status: DC | PRN
  Administered 2013-11-03: 1 via ORAL
  Filled 2013-11-02: qty 1

## 2013-11-02 MED ORDER — PROMETHAZINE HCL 25 MG/ML IJ SOLN
6.2500 mg | INTRAMUSCULAR | Status: DC | PRN
Start: 2013-11-02 — End: 2013-11-02
  Administered 2013-11-02: 6.25 mg via INTRAVENOUS

## 2013-11-02 MED ORDER — HYDROMORPHONE HCL PF 1 MG/ML IJ SOLN
0.2500 mg | INTRAMUSCULAR | Status: DC | PRN
  Administered 2013-11-02: 0.5 mg via INTRAVENOUS

## 2013-11-02 MED ORDER — IBUPROFEN 600 MG PO TABS
600.0000 mg | ORAL_TABLET | Freq: Four times a day (QID) | ORAL | Status: DC
  Administered 2013-11-02 – 2013-11-05 (×13): 600 mg via ORAL
  Filled 2013-11-02 (×13): qty 1

## 2013-11-02 MED ORDER — ONDANSETRON HCL 4 MG/2ML IJ SOLN
INTRAMUSCULAR | Status: DC | PRN
  Administered 2013-11-02: 4 mg via INTRAVENOUS

## 2013-11-02 MED ORDER — MORPHINE SULFATE (PF) 0.5 MG/ML IJ SOLN
INTRAMUSCULAR | Status: DC | PRN
  Administered 2013-11-02: .1 mg via INTRATHECAL

## 2013-11-02 MED ORDER — WITCH HAZEL-GLYCERIN EX PADS: 1.0000 "application " | MEDICATED_PAD | CUTANEOUS | Status: DC | PRN

## 2013-11-02 MED ORDER — ONDANSETRON HCL 4 MG/2ML IJ SOLN
INTRAMUSCULAR | Status: AC
  Filled 2013-11-02: qty 2

## 2013-11-02 MED ORDER — ONDANSETRON HCL 4 MG PO TABS: 4.0000 mg | ORAL_TABLET | ORAL | Status: DC | PRN

## 2013-11-02 MED ORDER — OXYTOCIN 10 UNIT/ML IJ SOLN
INTRAMUSCULAR | Status: AC
  Filled 2013-11-02: qty 4

## 2013-11-02 SURGICAL SUPPLY — 31 items
ADH SKN CLS APL DERMABOND .7 (GAUZE/BANDAGES/DRESSINGS) ×1
CLAMP CORD UMBIL (MISCELLANEOUS) IMPLANT
CLIP FILSHIE TUBAL LIGA STRL (Clip) ×4 IMPLANT
CLOTH BEACON ORANGE TIMEOUT ST (SAFETY) ×3 IMPLANT
DERMABOND ADVANCED (GAUZE/BANDAGES/DRESSINGS) ×2
DERMABOND ADVANCED .7 DNX12 (GAUZE/BANDAGES/DRESSINGS) IMPLANT
DRAPE LG THREE QUARTER DISP (DRAPES) IMPLANT
DRSG OPSITE POSTOP 4X10 (GAUZE/BANDAGES/DRESSINGS) ×3 IMPLANT
DURAPREP 26ML APPLICATOR (WOUND CARE) ×3 IMPLANT
ELECT REM PT RETURN 9FT ADLT (ELECTROSURGICAL) ×3
ELECTRODE REM PT RTRN 9FT ADLT (ELECTROSURGICAL) ×1 IMPLANT
EXTRACTOR VACUUM M CUP 4 TUBE (SUCTIONS) IMPLANT
EXTRACTOR VACUUM M CUP 4' TUBE (SUCTIONS)
GLOVE BIO SURGEON STRL SZ7 (GLOVE) ×3 IMPLANT
GOWN STRL REIN XL XLG (GOWN DISPOSABLE) ×6 IMPLANT
KIT ABG SYR 3ML LUER SLIP (SYRINGE) IMPLANT
NDL HYPO 25X5/8 SAFETYGLIDE (NEEDLE) IMPLANT
NEEDLE HYPO 25X5/8 SAFETYGLIDE (NEEDLE) IMPLANT
NS IRRIG 1000ML POUR BTL (IV SOLUTION) ×3 IMPLANT
PACK C SECTION WH (CUSTOM PROCEDURE TRAY) ×3 IMPLANT
PAD OB MATERNITY 4.3X12.25 (PERSONAL CARE ITEMS) ×3 IMPLANT
RTRCTR C-SECT PINK 25CM LRG (MISCELLANEOUS) ×3 IMPLANT
STAPLER VISISTAT 35W (STAPLE) IMPLANT
SUT CHROMIC 1 CTX 36 (SUTURE) ×6 IMPLANT
SUT PDS AB 0 CTX 60 (SUTURE) ×3 IMPLANT
SUT VIC AB 2-0 CT1 27 (SUTURE) ×3
SUT VIC AB 2-0 CT1 TAPERPNT 27 (SUTURE) ×1 IMPLANT
SUT VIC AB 4-0 KS 27 (SUTURE) IMPLANT
TOWEL OR 17X24 6PK STRL BLUE (TOWEL DISPOSABLE) ×3 IMPLANT
TRAY FOLEY CATH 14FR (SET/KITS/TRAYS/PACK) ×3 IMPLANT
WATER STERILE IRR 1000ML POUR (IV SOLUTION) ×1 IMPLANT

## 2013-11-02 NOTE — Anesthesia Procedure Notes (Signed)
Spinal  Patient location during procedure: OR Start time: 11/02/2013 11:34 AM End time: 11/02/2013 11:37 AM Staffing Anesthesiologist: Nolon Nations R Performed by: anesthesiologist  Preanesthetic Checklist Completed: patient identified, surgical consent, pre-op evaluation, timeout performed, IV checked, risks and benefits discussed and monitors and equipment checked Spinal Block Patient position: sitting Prep: site prepped and draped and DuraPrep Patient monitoring: heart rate, continuous pulse ox and blood pressure Approach: midline Location: L3-4 Injection technique: single-shot Needle Needle type: Sprotte  Needle gauge: 24 G Needle length: 9 cm Assessment Sensory level: T6 Additional Notes Expiration date of kit checked and confirmed. Patient tolerated procedure well, without complications.

## 2013-11-02 NOTE — Anesthesia Preprocedure Evaluation (Addendum)
Anesthesia Evaluation  Patient identified by MRN, date of birth, ID band Patient awake    Reviewed: Allergy & Precautions, H&P , NPO status , Patient's Chart, lab work & pertinent test results  Airway Mallampati: II TM Distance: >3 FB Neck ROM: Full    Dental  (+) Dental Advisory Given and Teeth Intact   Pulmonary neg pulmonary ROS,  breath sounds clear to auscultation        Cardiovascular hypertension, negative cardio ROS  Rhythm:Regular     Neuro/Psych negative neurological ROS  negative psych ROS   GI/Hepatic negative GI ROS, Neg liver ROS,   Endo/Other  negative endocrine ROS  Renal/GU negative Renal ROS     Musculoskeletal negative musculoskeletal ROS (+)   Abdominal   Peds  Hematology negative hematology ROS (+)   Anesthesia Other Findings   Reproductive/Obstetrics (+) Pregnancy                          Anesthesia Physical Anesthesia Plan  ASA: III and emergent  Anesthesia Plan: Spinal   Post-op Pain Management:    Induction:   Airway Management Planned:   Additional Equipment:   Intra-op Plan:   Post-operative Plan:   Informed Consent: I have reviewed the patients History and Physical, chart, labs and discussed the procedure including the risks, benefits and alternatives for the proposed anesthesia with the patient or authorized representative who has indicated his/her understanding and acceptance.   Dental advisory given  Plan Discussed with: CRNA  Anesthesia Plan Comments:         Anesthesia Quick Evaluation                                  Anesthesia Evaluation  Patient identified by MRN, date of birth, ID band Patient awake    Reviewed: Allergy & Precautions, H&P , NPO status , Patient's Chart, lab work & pertinent test results  Airway Mallampati: III TM Distance: >3 FB Neck ROM: full    Dental no notable dental hx. (+) Teeth Intact    Pulmonary neg pulmonary ROS,  breath sounds clear to auscultation  Pulmonary exam normal       Cardiovascular negative cardio ROS  Rhythm:regular Rate:Normal     Neuro/Psych negative neurological ROS  negative psych ROS   GI/Hepatic negative GI ROS, Neg liver ROS,   Endo/Other  Obesity  Renal/GU negative Renal ROS  negative genitourinary   Musculoskeletal negative musculoskeletal ROS (+)   Abdominal Normal abdominal exam  (+)   Peds  Hematology negative hematology ROS (+)   Anesthesia Other Findings   Reproductive/Obstetrics (+) Pregnancy Missed Ab                          Anesthesia Physical Anesthesia Plan  ASA: II  Anesthesia Plan: General   Post-op Pain Management:    Induction:   Airway Management Planned:   Additional Equipment:   Intra-op Plan:   Post-operative Plan:   Informed Consent: I have reviewed the patients History and Physical, chart, labs and discussed the procedure including the risks, benefits and alternatives for the proposed anesthesia with the patient or authorized representative who has indicated his/her understanding and acceptance.   Dental Advisory Given and Dental advisory given  Plan Discussed with: Anesthesiologist and CRNA  Anesthesia Plan Comments: (Surgeon request GA for procedure.)  Anesthesia Quick Evaluation

## 2013-11-02 NOTE — Progress Notes (Signed)
MD informed of pt c/o headache and RUQ pain.  MD order received for labs and give her tylenol for h/a

## 2013-11-02 NOTE — Progress Notes (Signed)
anestesia in to see pt

## 2013-11-02 NOTE — Op Note (Signed)
Pre-Operative Diagnosis: 1) 33+2 week intrauterine pregnancy 2) severe preeclampsia 3) history of prior cesarean section declines trial of labor 4) desire permanent sterilization Postoperative Diagnosis: Same Procedure: Repeat low transverse cesarean section via Pfannenstiel skin incision with bilateral tubal ligation with Filshie clips Surgeon: Dr. Waynard ReedsKendra Phoebie Shad Assistant:None Operative Findings:Vigorous female infant in the cephalic presentation. A loose nuchal cord x1. Normal-appearing ovaries, tubes, uterus. Specimen: Placenta to pathology EBL: Total I/O In: 900 [I.V.:900] Out: 650 [Urine:150; Blood:500]   Procedure:[name] is an 28 year old gravida 2 para 1001 at 833 weeks and 2 days estimated gestational age who presents for cesarean section. The patient was being followed for gestational hypertension. 24-hour urine that returned 9 g of protein. She was being managed with observation as an inpatient because she was asymptomatic. However, this morning she developed symptoms of a headache that would not resolve after Tylenol and right upper quadrant pain and the decision was made to proceed with delivery for severe preeclampsia. Following the appropriate informed consent the patient was brought to the operating room where spinal anesthesia was administered and found to be adequate. She was placed in the dorsal supine position with a leftward tilt. She was prepped and draped in the normal sterile fashion. Scalpel was then used to make a Pfannenstiel skin incision which was carried down to the underlying layers of soft tissue to the fascia. The fascia was incised in the midline and the fascial incision was extended laterally with Mayo scissors. The superior aspect of the fascial incision was grasped with Coker clamps x2, tented up and the rectus muscles dissected off sharply with the electrocautery unit area and the same procedure was repeated on the inferior aspect of the fascial incision. The rectus  muscles were separated in the midline. The abdominal peritoneum was identified, tented up, entered sharply, and the incision was extended superiorly and inferiorly with good visualization of the bladder. The Alexis retractor was then deployed. The vesicouterine peritoneum was identified, tented up, entered sharply, and the bladder flap was created digitally. Scalpel was then used to make a low transverse incision on the uterus which was extended laterally with both blunt dissection and the bandage scissors. The fetal vertex was identified, delivered easily through the uterine incision followed by the body. Fundal pressure was not applied during the delivery of the infant for concern for the patient's right upper quadrant pain. The infant was bulb suctioned on the operative field cried vigorously, cord was clamped and cut and the infant was passed to the waiting neonatologist. Placenta was then delivered spontaneously, the uterus was cleared of all clot and debris. The uterine incision was repaired with #1 chromic in running locked fashion. Ovaries and tubes were inspected and normal. Attention was turned to the tubal ligation portion of the procedure. The left fallopian tube was identified, grasped with a Babcock clamp, tented up, and a Filshie clip was applied circumferentially around the tube. The same procedure was repeated on the right tube. The tubes and ovaries were noted to be normal.The Alexis retractor was removed. The abdominal peritoneum was reapproximated with 2-0 Vicryl in a running fashion, the rectus muscles was reapproximated with #1 chromic in a running fashion. The fascia was closed with a looped PDS in a running fashion.The subcuticular tissue was reapproximated with 2-0 plain gut in an interrupted fashion. The skin was closed with 4-0 vicryl in a subcuticular fashion and Dermabond. All sponge lap and needle counts were correct x2. Patient tolerated the procedure well and recovered in stable  condition following the procedure.

## 2013-11-02 NOTE — Progress Notes (Signed)
To the OR via stretcher

## 2013-11-02 NOTE — Progress Notes (Signed)
Patient ID: Alexandra FusJamie O Yablonski, female   DOB: 02/19/1986, 28 y.o.   MRN: [id number]   HD#3 Pre-eclampsia  S: This morning the patient has developed RUQ pain and a headache that has not resolved despite tylenol. She has required several doses of labetalol IV overnight O: Filed Vitals:   11/02/13 0700 11/02/13 0701 11/02/13 0720 11/02/13 0738  BP: 168/88 138/72    Pulse: 69 67    Temp: 98.3 F (36.8 C)  98.2 F (36.8 C)   TempSrc:   Oral   Resp:  20    Height:      Weight:    123.832 kg (273 lb)  SpO2:       AOX3, concerned appearing Abd tender in upper portion FHR reactive Cvx deferred  CMP     Component Value Date/Time   NA 136* 11/02/2013 0735   K 4.1 11/02/2013 0735   CL 105 11/02/2013 0735   CO2 20 11/02/2013 0735   GLUCOSE 78 11/02/2013 0735   BUN 18 11/02/2013 0735   CREATININE 0.81 11/02/2013 0735   CREATININE 0.94 09/20/2010 1824   CALCIUM 8.3* 11/02/2013 0735   PROT 5.1* 11/02/2013 0735   ALBUMIN 2.1* 11/02/2013 0735   AST 18 11/02/2013 0735   ALT 10 11/02/2013 0735   ALKPHOS 67 11/02/2013 0735   BILITOT <0.2* 11/02/2013 0735   GFRNONAA >90 11/02/2013 0735   GFRAA >90 11/02/2013 0735    CBC    Component Value Date/Time   WBC 15.4* 11/02/2013 0735   RBC 3.70* 11/02/2013 0735   HGB 11.0* 11/02/2013 0735   HCT 32.0* 11/02/2013 0735   PLT 126* 11/02/2013 0735   MCV 86.5 11/02/2013 0735   MCH 29.7 11/02/2013 0735   MCHC 34.4 11/02/2013 0735   RDW 13.0 11/02/2013 0735   LYMPHSABS 1.2 09/24/2010 0505   MONOABS 0.5 09/24/2010 0505   EOSABS 0.0 09/24/2010 0505   BASOSABS 0.0 09/24/2010 0505     A/P 1) Pt is now displaying severe features of pre-eclampsia. Labs have remained normal but platelets are dropping. Will proceed with delivery at this point to allow for regional anesthesia. R/B/A of delivery at this stage versus waiting were reviewed. Also, discussed risks of repeat cesarean and potential need for blood transfusion.  Will T&C for 2 units 2) Magnesium sulfate for seizure  prophylaxis 3) Pt has a childhood PCN allergy. She is unsure of her reaction. Will proceed with cefotetan

## 2013-11-02 NOTE — Progress Notes (Signed)
Lab results to MD, per MD keep pt NPO and she would come and evaluate.

## 2013-11-02 NOTE — Anesthesia Postprocedure Evaluation (Signed)
Anesthesia Post Note  Patient: Alexandra May  Procedure(s) Performed: Procedure(s) (LRB): CESAREAN SECTION (N/A)  Anesthesia type: Spinal  Patient location: PACU  Post pain: Pain level controlled  Post assessment: Post-op Vital signs reviewed  Last Vitals: BP 150/94  Pulse 74  Temp(Src) 36.6 C (Oral)  Resp 12  Ht 5\' 7"  (1.702 m)  Wt 273 lb (123.832 kg)  BMI 42.75 kg/m2  SpO2 97%  Post vital signs: Reviewed  Level of consciousness: sedated  Complications: No apparent anesthesia complications

## 2013-11-02 NOTE — Transfer of Care (Signed)
Immediate Anesthesia Transfer of Care Note  Patient: Alexandra FusJamie O May  Procedure(s) Performed: Procedure(s): CESAREAN SECTION (N/A)  Patient Location: PACU  Anesthesia Type:Spinal  Level of Consciousness: awake, alert  and oriented  Airway & Oxygen Therapy: Patient Spontanous Breathing  Post-op Assessment: Report given to PACU RN and Post -op Vital signs reviewed and stable  Post vital signs: Reviewed and stable  Complications: No apparent anesthesia complications

## 2013-11-03 LAB — COMPREHENSIVE METABOLIC PANEL
ALT: 16 U/L (ref 0–35)
AST: 26 U/L (ref 0–37)
Albumin: 1.9 g/dL — ABNORMAL LOW (ref 3.5–5.2)
Alkaline Phosphatase: 65 U/L (ref 39–117)
BUN: 16 mg/dL (ref 6–23)
CALCIUM: 7.7 mg/dL — AB (ref 8.4–10.5)
CO2: 22 meq/L (ref 19–32)
Chloride: 104 mEq/L (ref 96–112)
Creatinine, Ser: 0.9 mg/dL (ref 0.50–1.10)
GFR calc Af Amer: >90 mL/min (ref >90)
GFR calc non Af Amer: 86 mL/min — ABNORMAL LOW (ref >90)
Glucose, Bld: 118 mg/dL — ABNORMAL HIGH (ref 70–99)
Potassium: 4.3 mEq/L (ref 3.7–5.3)
Sodium: 136 mEq/L — ABNORMAL LOW (ref 137–147)
Total Bilirubin: <0.2 mg/dL — ABNORMAL LOW (ref 0.3–1.2)
Total Protein: 4.7 g/dL — ABNORMAL LOW (ref 6.0–8.3)

## 2013-11-03 LAB — CBC
HEMATOCRIT: 27.3 % — AB (ref 36.0–46.0)
HEMATOCRIT: 29.1 % — AB (ref 36.0–46.0)
Hemoglobin: 10.2 g/dL — ABNORMAL LOW (ref 12.0–15.0)
Hemoglobin: 9.5 g/dL — ABNORMAL LOW (ref 12.0–15.0)
MCH: 29.9 pg (ref 26.0–34.0)
MCH: 30.1 pg (ref 26.0–34.0)
MCHC: 34.8 g/dL (ref 30.0–36.0)
MCHC: 35.1 g/dL (ref 30.0–36.0)
MCV: 85.8 fL (ref 78.0–100.0)
MCV: 85.8 fL (ref 78.0–100.0)
PLATELETS: 114 10*3/uL — AB (ref 150–400)
PLATELETS: 98 10*3/uL — AB (ref 150–400)
RBC: 3.18 MIL/uL — ABNORMAL LOW (ref 3.87–5.11)
RBC: 3.39 MIL/uL — ABNORMAL LOW (ref 3.87–5.11)
RDW: 13 % (ref 11.5–15.5)
RDW: 13.1 % (ref 11.5–15.5)
WBC: 12.2 10*3/uL — AB (ref 4.0–10.5)
WBC: 16 10*3/uL — ABNORMAL HIGH (ref 4.0–10.5)

## 2013-11-03 LAB — MAGNESIUM: MAGNESIUM: 4.8 mg/dL — AB (ref 1.5–2.5)

## 2013-11-03 LAB — RPR: RPR Ser Ql: NONREACTIVE

## 2013-11-03 LAB — LACTATE DEHYDROGENASE: LDH: 203 U/L (ref 94–250)

## 2013-11-03 MED ORDER — BREAST MILK
ORAL | Status: DC
  Administered 2013-11-03: 05:00:00 via GASTROSTOMY
  Filled 2013-11-03: qty 1

## 2013-11-03 NOTE — Anesthesia Postprocedure Evaluation (Signed)
  Anesthesia Post-op Note  Patient: Alexandra FusJamie O [place]joy  Procedure(s) Performed: Procedure(s): CESAREAN SECTION (N/A)  Patient Location: [facility]  Anesthesia Type:Spinal  Level of Consciousness: awake, alert  and oriented  Airway and Oxygen Therapy: Patient Spontanous Breathing  Post-op Pain: mild  Post-op Assessment: Patient's Cardiovascular Status Stable, Respiratory Function Stable, Patent Airway, No signs of Nausea or vomiting, Pain level controlled, No headache, No backache, No residual numbness and No residual motor weakness  Post-op Vital Signs: stable  Complications: No apparent anesthesia complications

## 2013-11-03 NOTE — Progress Notes (Signed)
Lying on right side

## 2013-11-03 NOTE — Lactation Note (Addendum)
This note was copied from the chart of [name]. Lactation Consultation Note     Initial consult with this mom of a 33 3/[redacted] weeks gestation NICU baby, now 28 hours post partum. Mom began pumping yesterday, and hs expressed small amounts of colsosum. Today, I showed mom how to hand express, and I was able to express about 0.3 mls. Mom has very large breast with very flat nipples. Mom reports that with her last baby,  she lost her  milk supply when she had to go on labetalol . I told mom it was probably the high BP , not the medication, that caused the decrease.  I reviewed the NICU booklet on providing EBm , with mom. She is familiar with pumping, since her 30 year old was also a 1633 weeker. Lactation services reviewed with mom. Mom knows to call for questions/concerns.  Patient Name: [name] [id number]'s Date: 11/03/2013 Reason for consult: Initial assessment;NICU baby;Late preterm infant;Infant < 6lbs   Maternal Data Formula Feeding for Exclusion: Yes (baby in NICU) Reason for exclusion: Admission to Intensive Care Unit (ICU) post-partum Infant to breast within first hour of birth: No Breastfeeding delayed due to:: Infant status Has patient been taught Hand Expression?: Yes Does the patient have breastfeeding experience prior to this delivery?: Yes  Feeding    LATCH Score/Interventions                      Lactation Tools Discussed/Used Tools: Pump Breast pump type: Double-Electric Breast Pump WIC Program: No Pump Review: Setup, frequency, and cleaning;Milk Storage;Other (comment) (premie setting, hand expression, NICU book on EBm reviewed) Initiated by:: by bedside RN Date initiated:: 11/02/13   Consult Status Consult Status: Follow-up Date: 11/04/13 Follow-up type: In-patient    [name], [name] 11/03/2013, 4:27 PM

## 2013-11-03 NOTE — Progress Notes (Signed)
Clinical Social Work Department PSYCHOSOCIAL ASSESSMENT - MATERNAL/CHILD 11/03/2013  Patient:  Alexandra May, Alexandra May  Account Number:  [id number]  [place] Date:  10/31/2013  Ardine Eng Name:   Alexandra May    Clinical Social Worker:  Alaiya Martindelcampo, LCSW   Date/Time:  11/03/2013 10:30 AM  Date Referred:  11/03/2013   Referral source  NICU     Referred reason  NICU   Other referral source:    I:  FAMILY / HOME ENVIRONMENT Child's legal guardian:  PARENT  Guardian - Name Guardian - Age Guardian - Address  Alexandra May,Alexandra May [address], [place] [address]  Alexandra May, Alexandra May  same as above   Other household support members/support persons Other support:    II  PSYCHOSOCIAL DATA Information Source:    Occupational hygienist Employment:   Museum/gallery curator resources:  Multimedia programmer If [place]:    School / Grade:   Maternity Care Coordinator / Child Services Coordination / Early Interventions:  Cultural issues impacting care:    III  STRENGTHS Strengths  Understanding of illness  Home prepared for Child (including basic supplies)  Adequate Resources  Supportive family/friends   Strength comment:    IV  RISK FACTORS AND CURRENT PROBLEMS Current Problem:       V  SOCIAL WORK ASSESSMENT Met with both parents.  They were pleasant and receptive to social work intervention.    Parents are married and have one other dependent age 76.  Informed that the three year old was a NICU graduate.     Both parents are employed and mother reports plan to return to work.  Both parents seems to be coping well with newborn NICU admission.  Informed that they have spoken with the medical team and feels comfortable with the care newborn is receiving.  Mother denies any hx of substance abuse or mental illness.   No acute social concerns related at this time.  Parents informed of social work Fish farm manager.      VI SOCIAL WORK PLAN Social Work Plan  No Further Intervention Required / No Barriers  to Discharge

## 2013-11-03 NOTE — Progress Notes (Signed)
Pt. States she was "working on her lap top and suddenly had frontal H/A" does not want medication, just wanted to make us aware

## 2013-11-03 NOTE — Progress Notes (Signed)
Patient ID: Alexandra FusJamie O May, female   DOB: 08/31/1986, 28 y.o.   MRN: [id number]  S: Pt feeling much better. Pain controlled with motrin O: Filed Vitals:   11/03/13 0800 11/03/13 0900 11/03/13 0936 11/03/13 1048  BP: 134/80 160/98 151/101 156/87  Pulse: 81   75  Temp: 98 F (36.7 C)     TempSrc: Oral     Resp: 18 16  20   Height:      Weight:      SpO2: 99%   100%  UOP 1400cc this shift AOX3, NAD Abd soft appropriately tender, [employer] C/D/I  CMP     Component Value Date/Time   NA 136* 11/03/2013 0005   K 4.3 11/03/2013 0005   CL 104 11/03/2013 0005   CO2 22 11/03/2013 0005   GLUCOSE 118* 11/03/2013 0005   BUN 16 11/03/2013 0005   CREATININE 0.90 11/03/2013 0005   CREATININE 0.94 09/20/2010 1824   CALCIUM 7.7* 11/03/2013 0005   PROT 4.7* 11/03/2013 0005   ALBUMIN 1.9* 11/03/2013 0005   AST 26 11/03/2013 0005   ALT 16 11/03/2013 0005   ALKPHOS 65 11/03/2013 0005   BILITOT <0.2* 11/03/2013 0005   GFRNONAA 86* 11/03/2013 0005   GFRAA >90 11/03/2013 0005   CBC    Component Value Date/Time   WBC 12.2* 11/03/2013 0515   RBC 3.18* 11/03/2013 0515   HGB 9.5* 11/03/2013 0515   HCT 27.3* 11/03/2013 0515   PLT 98* 11/03/2013 0515   MCV 85.8 11/03/2013 0515   MCH 29.9 11/03/2013 0515   MCHC 34.8 11/03/2013 0515   RDW 13.0 11/03/2013 0515   LYMPHSABS 1.2 09/24/2010 0505   MONOABS 0.5 09/24/2010 0505   EOSABS 0.0 09/24/2010 0505   BASOSABS 0.0 09/24/2010 0505    A/P 1) POD#1 s/p Repeat C/S & BTL for severe pre-eclampsia 2) Stop Mag at 24 hr PP 3) Pt states milk supply declined with initiation of antihypertensives after her last delivery.  Will monitor BPs  4) Transfer to floor 4 hours after mag off if stable

## 2013-11-04 ENCOUNTER — Encounter (HOSPITAL_COMMUNITY): Payer: Self-pay | Admitting: Obstetrics and Gynecology

## 2013-11-04 LAB — CBC
HCT: 27.4 % — ABNORMAL LOW (ref 36.0–46.0)
Hemoglobin: 9.4 g/dL — ABNORMAL LOW (ref 12.0–15.0)
MCH: 30 pg (ref 26.0–34.0)
MCHC: 34.3 g/dL (ref 30.0–36.0)
MCV: 87.5 fL (ref 78.0–100.0)
PLATELETS: 120 10*3/uL — AB (ref 150–400)
RBC: 3.13 MIL/uL — AB (ref 3.87–5.11)
RDW: 13.5 % (ref 11.5–15.5)
WBC: 12 10*3/uL — AB (ref 4.0–10.5)

## 2013-11-04 LAB — TYPE AND SCREEN
ABO/RH(D): A POS
ANTIBODY SCREEN: NEGATIVE
Unit division: 0
Unit division: 0

## 2013-11-04 NOTE — Progress Notes (Signed)
POD#2 Pt without complaints. No headache or vision changes. B/P still elevated but do not warrant medication. Plts continue to fall.  IMP/ S/P repeat C/S and BTL for preeclampsia, Thrombocytopenia Plan/ Will repeat CBC this am. If stable in am probably discharge to home.

## 2013-11-04 NOTE — Lactation Note (Signed)
This note was copied from the chart of Alexandra Alveda ReasonsJamie Ohmer. Lactation Consultation Note  Patient Name: Alexandra May [id number]'s Date: 11/04/2013     Maternal Data  Mom is pumping every 3 hours and obtaining a few mls.  No questions/concerns at this point.  Mom does have a DEBP at home for use after discharge.  Encouraged to call with any concerns.    Feeding Feeding Type: Formula Length of feed: 20 min  LATCH Score/Interventions                      Lactation Tools Discussed/Used     Consult Status      Hansel Feinsteinowell, Sheena Simonis Ann 11/04/2013, 11:36 AM

## 2013-11-04 NOTE — Progress Notes (Signed)
Ur chart review completed.  

## 2013-11-05 MED ORDER — OXYCODONE-ACETAMINOPHEN 5-325 MG PO TABS: 1.0000 | ORAL_TABLET | ORAL | Status: DC | PRN

## 2013-11-05 MED ORDER — LABETALOL HCL 100 MG PO TABS
100.0000 mg | ORAL_TABLET | Freq: Two times a day (BID) | ORAL | Status: DC
  Administered 2013-11-05: 100 mg via ORAL
  Filled 2013-11-05 (×3): qty 1

## 2013-11-05 MED ORDER — LABETALOL HCL 100 MG PO TABS: 100.0000 mg | ORAL_TABLET | Freq: Two times a day (BID) | ORAL | Status: DC

## 2013-11-05 NOTE — Discharge Summary (Signed)
Obstetric Discharge Summary Reason for Admission: induction of labor and observation/evaluation of GHTN with proteinuria remote from term Prenatal Procedures: Preeclampsia and ultrasound Intrapartum Procedures: cesarean: low cervical, transverse with BTL Postpartum Procedures: none Complications-Operative and Postpartum: none Hemoglobin  Date Value Range Status  11/04/2013 9.4* 12.0 - 15.0 g/dL Final     HCT  Date Value Range Status  11/04/2013 27.4* 36.0 - 46.0 % Final    Physical Exam:  General: alert and cooperative Lochia: appropriate Uterine Fundus: firm Incision: healing well, no significant drainage, no significant erythema DVT Evaluation: No evidence of DVT seen on physical exam.  Discharge Diagnoses: Preelampsia and preterm c/s  Discharge Information: Date: 11/05/2013 Activity: pelvic rest Diet: routine Medications: PNV, Ibuprofen and Percocet Condition: stable Instructions: refer to practice specific booklet Discharge to: home Follow-up Information   Follow up with [name],[name], MD In 2 weeks.   Specialty:  Obstetrics and Gynecology   Contact information:   [address] [place] [address] [phone]       Newborn Data: Live born female  Birth Weight: 4 lb 13.6 oz (2200 g) APGAR: 8, 9  Home with mother.  Alexandra May, Alexandra May 11/05/2013, 5:01 PM

## 2013-11-05 NOTE — Progress Notes (Signed)
POD#3 Patient is eating, ambulating, voiding.  Pain control is good.  Would like to go home today  Filed Vitals:   11/04/13 1807 11/04/13 2219 11/05/13 0137 11/05/13 0620  BP: 154/89 156/95 161/97 160/93  Pulse: 97 75 102 72  Temp: 98.6 F (37 C) 98.4 F (36.9 C) 98.1 F (36.7 C) 98.1 F (36.7 C)  TempSrc: Oral Oral Oral Oral  Resp: 18 18 18 18   Height:      Weight:    118.616 kg (261 lb 8 oz)  SpO2:  100% 100% 100%    [employer]: c/d/i Ext: no CT   Lab Results  Component Value Date   WBC 12.0* 11/04/2013   HGB 9.4* 11/04/2013   HCT 27.4* 11/04/2013   MCV 87.5 11/04/2013   PLT 120* 11/04/2013    --/--/A POS, A POS (01/22 0945) A/P Post op day #3 s/p 33.2week c/s with BTL for severe preeclampsia. Pt has had 2 severe range BPs so far today.  She remains asymptomatic.  She states she is fine with starting a BP med, she has not declined previously.  Will start labetalol 100bid, continue to monitor this morning and after noon, if normal to mild range BPs and remains asymptomatic will d/c home with Rx and instructions to f/u in office in 2 days for BP check.  Routine care.  Claiborne Billings\   Yarima Penman, Luther ParodySIDNEY

## 2013-11-05 NOTE — Lactation Note (Signed)
This note was copied from the chart of Alexandra Alveda ReasonsJamie Tirey. Lactation Consultation Note   Follow up consult with this mom of a NICU baby, now 33 5/7 weeks corrected gestation. I assisted mom with latching her baby for the first time. Mom has flat nipples, and I applied a 20 nipple shield , and placed formula into the shield with a curved tip syringe.the baby suckled strongly, intermittently, while the baby was fed via ng tube. Discharge teaching on DEP done with mom. i will follow this mom and baby ion the nICU  Patient Name: Alexandra May UJWJX'BToday's Date: 11/05/2013 Reason for consult: Follow-up assessment;NICU baby;Late preterm infant   Maternal Data    Feeding Feeding Type: Breast Fed Length of feed: 30 min  LATCH Score/Interventions Latch: Repeated attempts needed to sustain latch, nipple held in mouth throughout feeding, stimulation needed to elicit sucking reflex. (used 20 nipple shiled filled w formula - baby latached ans suckled intermittently) Intervention(s): Adjust position;Assist with latch  Audible Swallowing: A few with stimulation Intervention(s): Skin to skin;Hand expression  Type of Nipple: Flat Intervention(s): Double electric pump  Comfort (Breast/Nipple): Soft / non-tender     Hold (Positioning): Assistance needed to correctly position infant at breast and maintain latch. Intervention(s): Breastfeeding basics reviewed;Support Pillows;Position options;Skin to skin  LATCH Score: 6  Lactation Tools Discussed/Used Tools: Nipple Shields Nipple shield size: 20;24 Pump Review: Setup, frequency, and cleaning   Consult Status Consult Status: PRN Follow-up type:  (in NICU)    Alexandra May, Alexandra May 11/05/2013, 2:11 PM

## 2013-11-08 ENCOUNTER — Ambulatory Visit: Payer: Self-pay

## 2013-11-08 NOTE — Lactation Note (Signed)
This note was copied from the chart of Alexandra Alveda ReasonsJamie [place]. Lactation Consultation Note      Follow up consult with this mom of a NICU baby, now 126 days old, and 34 1/7 weeks corrected gestation. Mom reports a blister on her nipple. i advised her to apply EBM, increase to 30 flanges, and decrease her suction, and increase massage to express milk with pumping. Mom is pumping 45-60 mls 8 times a day, and wanted information on increasing her supply. I advised power pumping, moringa and fenugreek, increasing her frequency of pumping, staying hydrated, finding "time for herself", skin to skin and hand expression.  I will follow this mom and baby in the nICU.  Patient Name: Alexandra May ZOXWR'UToday's Date: 11/08/2013 Reason for consult: Follow-up assessment;NICU baby   Maternal Data    Feeding Feeding Type: Breast Milk Length of feed: 30 min  LATCH Score/Interventions                      Lactation Tools Discussed/Used     Consult Status Consult Status: Follow-up Follow-up type:  (prn in NICU)    [name], Paxton Kanaan Anne 11/08/2013, 1:58 PM

## 2013-11-12 ENCOUNTER — Ambulatory Visit: Payer: Self-pay

## 2013-11-12 NOTE — Lactation Note (Signed)
This note was copied from the chart of [name]. Lactation Consultation Note     Follow up consult with this mom and baby in NICU, now 5510 days old, and 34 5/7 weeks corrected gestation. I did a pre and post weight on the baby, while mom breast fed him , with a 20 nipple shield. There was milk in the shield after the feed, but the baby did not transfer any milk. I explained to mom that he is still small( 5-5), and was only latched to her nipple, so this is normal for now. As he gets older and bigger, he will be better able to transfer at the breast. Mom knows o/p lactation is available  After the baby is discharged to hoj, for help transitioning him to full breast feeding.  Patient Name: [name] ZOXWR'UToday's Date: 11/12/2013 Reason for consult: Follow-up assessment   Maternal Data    Feeding Feeding Type: Breast Milk with Formula added Nipple Type: Slow - flow Length of feed: 40 min  LATCH Score/Interventions Latch: Grasps breast easily, tongue down, lips flanged, rhythmical sucking. (20 nipple shield used) Intervention(s): Adjust position;Assist with latch  Audible Swallowing: None Intervention(s): Skin to skin  Type of Nipple: Flat Intervention(s):  (milk seen in NS afater feed)  Comfort (Breast/Nipple): Soft / non-tender     Hold (Positioning): No assistance needed to correctly position infant at breast. Intervention(s): Breastfeeding basics reviewed;Support Pillows;Position options;Skin to skin  LATCH Score: 7  Lactation Tools Discussed/Used     Consult Status Consult Status: Follow-up Follow-up type:  (prn in NICU)    [name], Bonner Larue Anne 11/12/2013, 3:25 PM

## 2013-11-21 ENCOUNTER — Ambulatory Visit: Payer: Self-pay

## 2013-11-21 NOTE — Lactation Note (Signed)
This note was copied from the chart of Alexandra Alveda ReasonsJamie Minchew. Lactation Consultation Note    Follow up consult with this mom of a NICU baby, now 522 weeks old, and 36 weeks corrected gestation. Baby weighs almost 6 pounds, and may go home in the next couple of days, as per mom. Mom is awre she can come back to see me in o/p lactation, for help with transitioning him to breast feeding. Mom doing well working out a knot in her left breast, with heat and massage. Mom has no signs and symptoms of illness at this time.  Patient Name: Alexandra May'UToday's Date: 11/21/2013 Reason for consult: Follow-up assessment;NICU baby   Maternal Data    Feeding Feeding Type: Bottle Fed - Breast Milk Nipple Type: Regular Length of feed: 20 min  LATCH Score/Interventions          Comfort (Breast/Nipple):  (tender knot of milk/clogged duct, in left breast at 3 o'clock. Heat helping with massage during pumping. Mom to pump as she feels dicomfort - not to wait the full 3 hours , if necessary. Mom advised to call erh MD for any redness,fever, chills, etc. )           Lactation Tools Discussed/Used     Consult Status Consult Status: Follow-up Date: 11/22/13 Follow-up type: In-patient    Alexandra May, Alexandra May 11/21/2013, 2:04 PM

## 2014-08-11 ENCOUNTER — Encounter (HOSPITAL_COMMUNITY): Payer: Self-pay | Admitting: Obstetrics and Gynecology

## 2015-02-22 IMAGING — US US OB DETAIL+14 WK
1 series · 12 of 28 positions shown · non-contrast
Comparison: none

[Series 1: us ob detail+14 wk · 0.19mm/px · 12 of 72 slices shown]
[im 3/72]
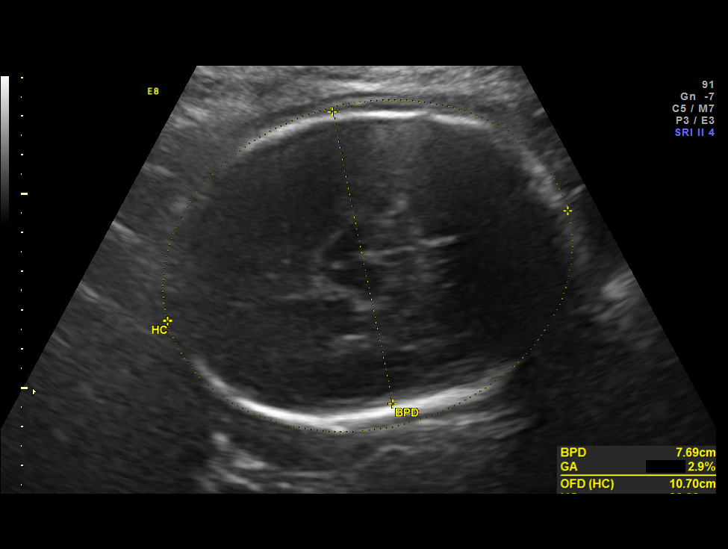
[im 8/72]
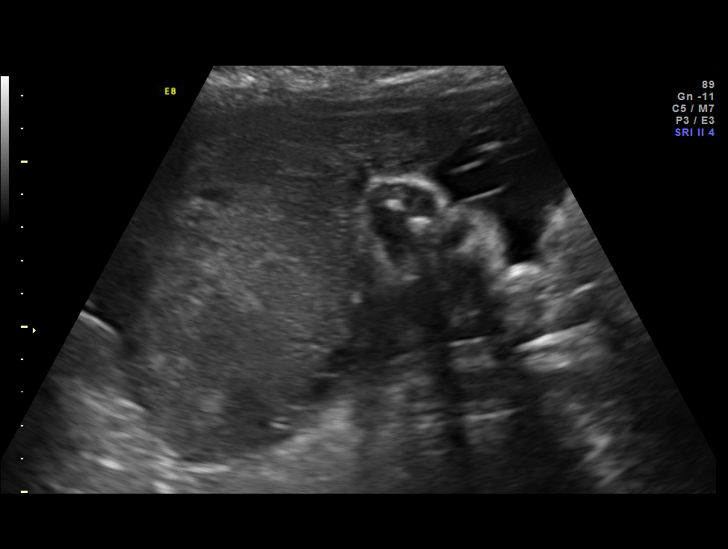
[im 14/72]
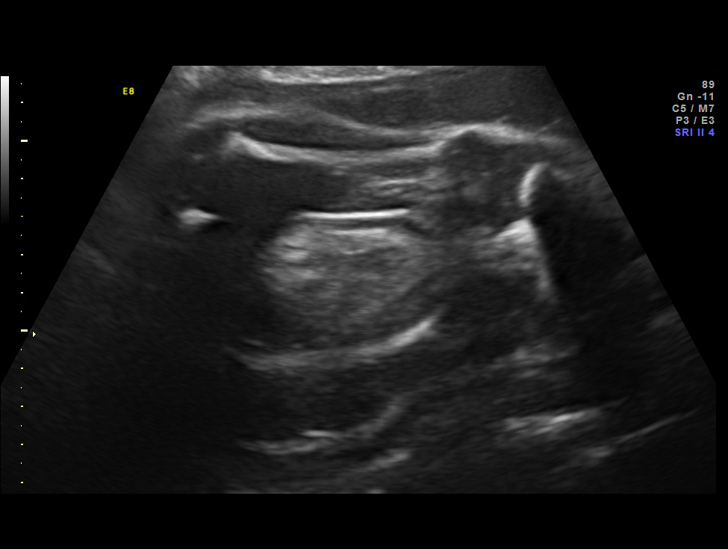
[im 22/72]
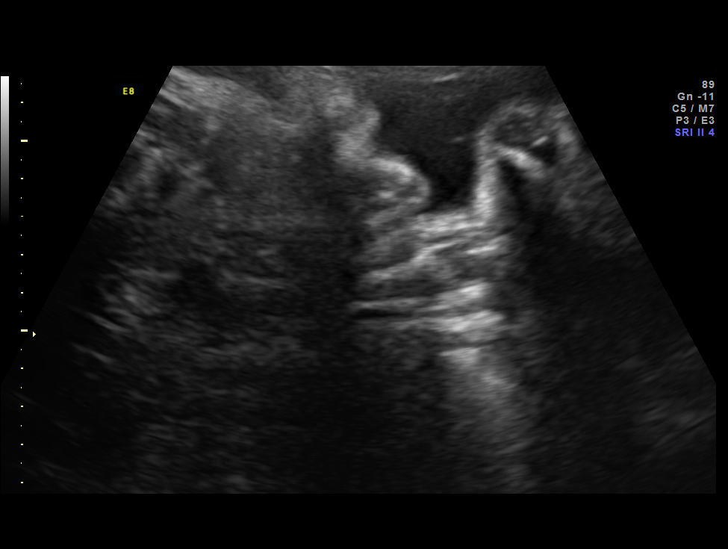
[im 27/72]
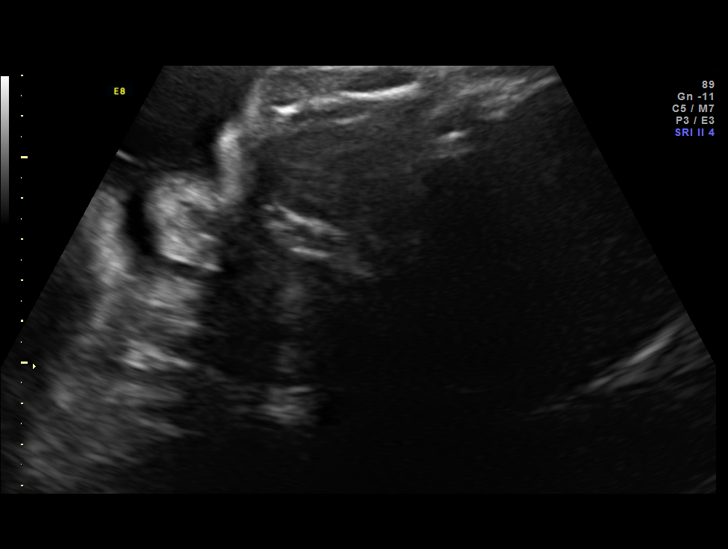
[im 32/72]
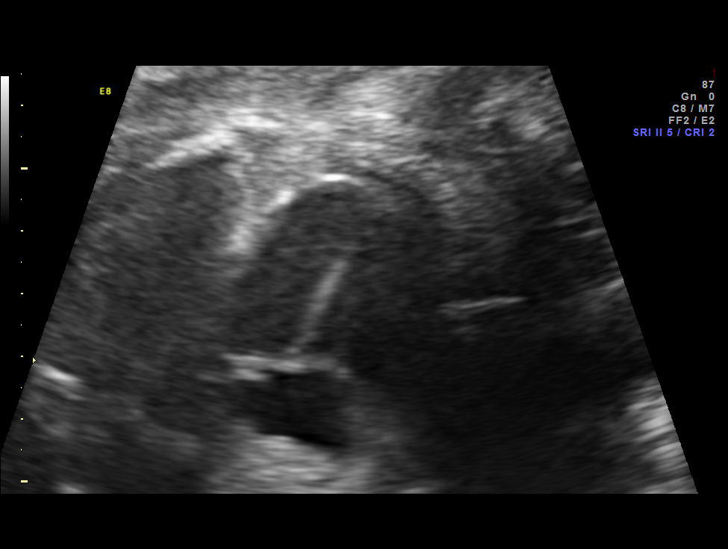
[im 40/72]
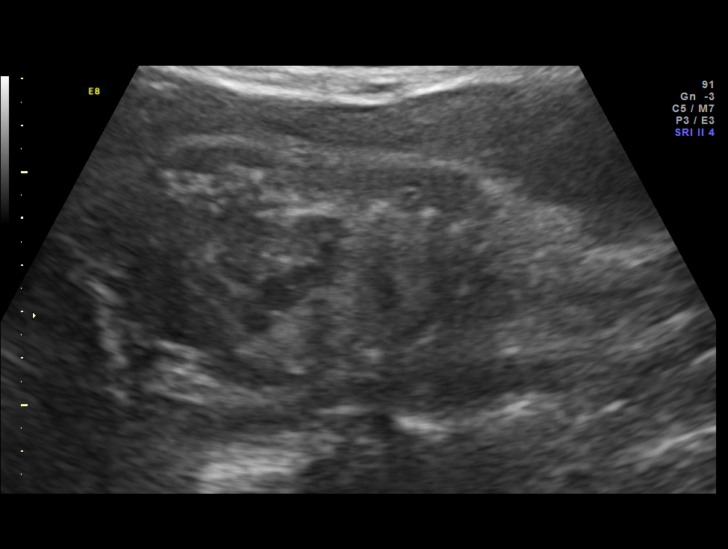
[im 45/72]
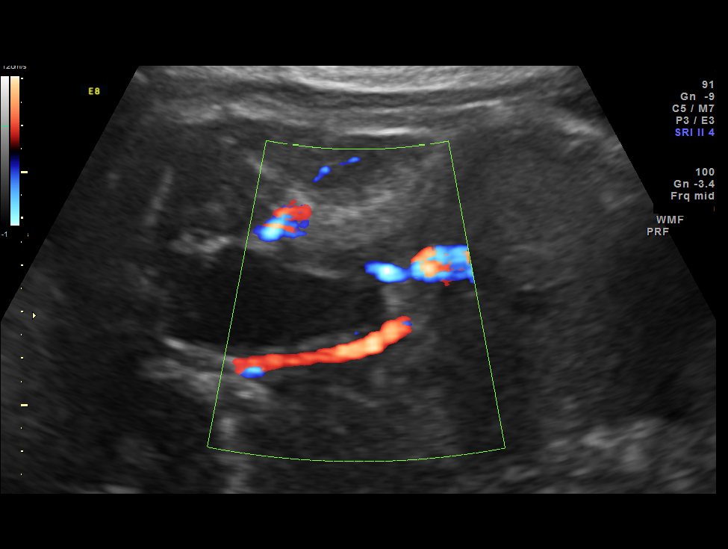
[im 50/72]
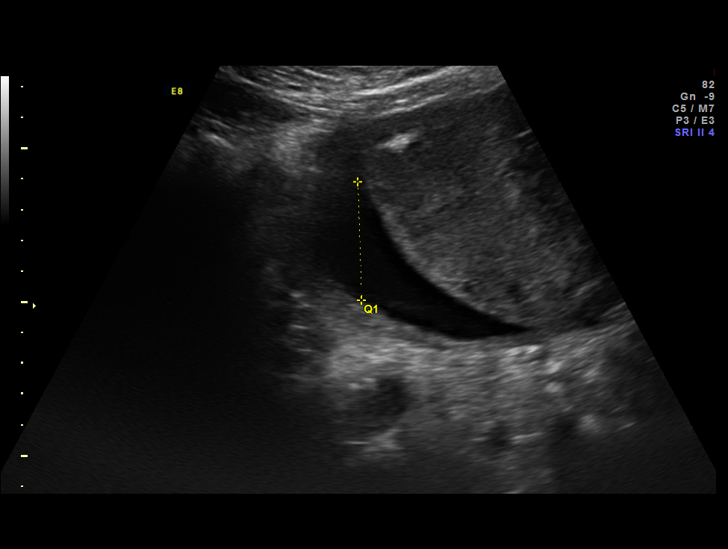
[im 58/72]
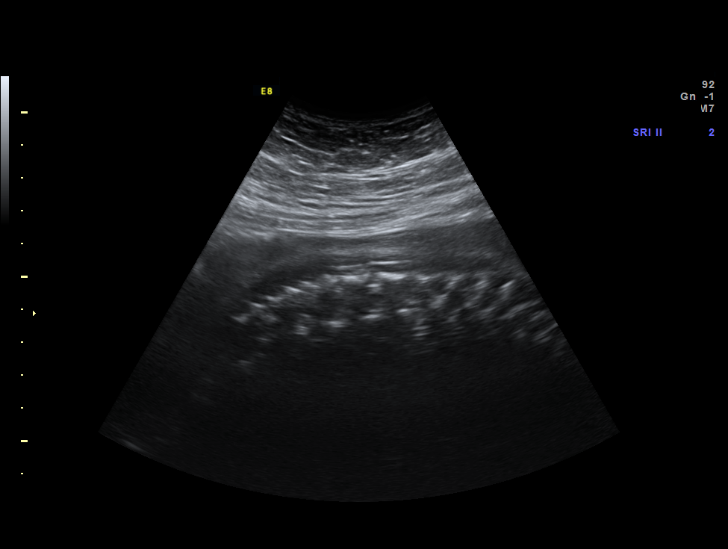
[im 64/72]
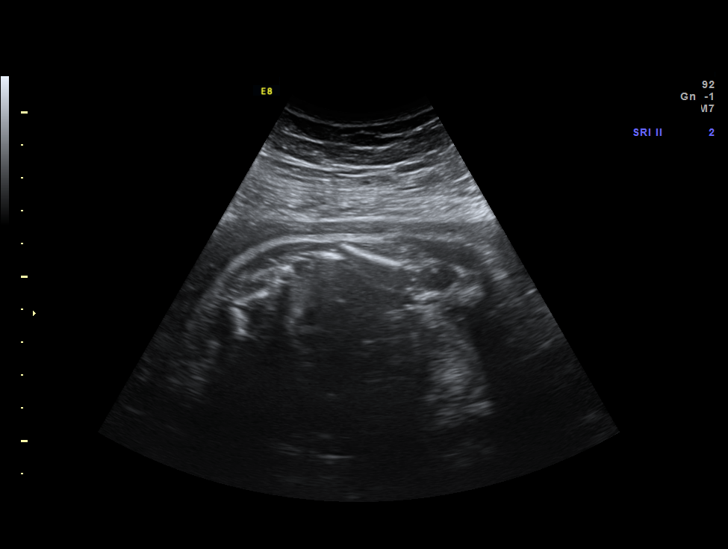
[im 69/72]
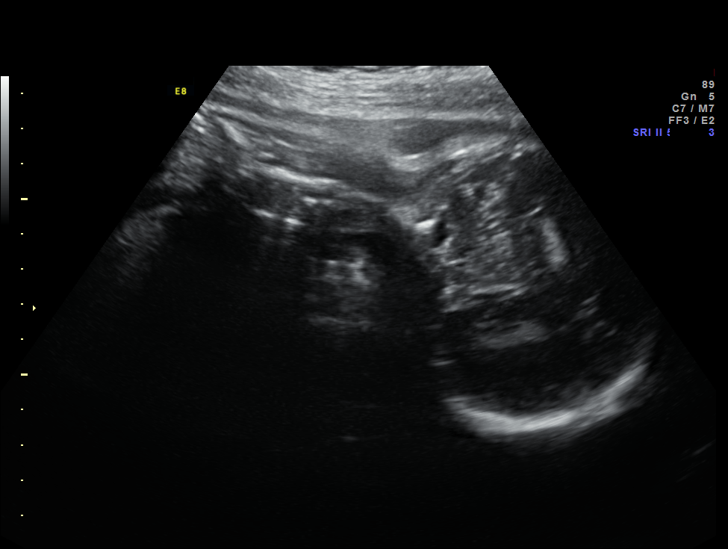

[12 of 28 positions shown; findings below may reference images not displayed]

OBSTETRICS REPORT
                    (Corrected Final 11/01/2013 [DATE])

Service(s) Provided

 US OB DETAIL + 14 WK                                  76811.0
 US UA CORD DOPPLER                                    76820.0
Indications

 Detailed fetal anatomic survey
 Poor obstetric history: Previous preeclampsia /
 eclampsia/ HELLP
 Hypertension - Preeclampsia
 Previous cesarean section
 Poor obstetric history: Previous preterm delivery
Fetal Evaluation

 Num Of Fetuses:    1
 Fetal Heart Rate:  129                          bpm
 Cardiac Activity:  Observed
 Presentation:      Cephalic
 Placenta:          Fundal, above cervical os
 P. Cord            Not well visualized
 Insertion:

 Amniotic Fluid
 AFI FV:      Subjectively within normal limits
 AFI Sum:     11.29   cm       27  %Tile     Larg Pckt:    3.86  cm
 RUQ:   3.86    cm   RLQ:    2.11   cm    LUQ:   3.23    cm   LLQ:    2.09   cm
Biometry

 BPD:     77.8  mm     G. Age:  31w 2d                CI:         72.1   70 - 86
 OFD:    107.9  mm                                    FL/HC:      19.3   19.9 -

 HC:     302.1  mm     G. Age:  33w 4d       26  %    HC/AC:      1.16   0.96 -

 AC:     261.4  mm     G. Age:  30w 2d      < 3  %    FL/BPD:     75.1   71 - 87
 FL:      58.4  mm     G. Age:  30w 4d      < 3  %    FL/AC:      22.3   20 - 24
 HUM:     59.4  mm     G. Age:  34w 3d       88  %

 Est. FW:    8553  gm      3 lb 9 oz     19  %
Gestational Age

 U/S Today:     31w 3d                                        EDD:   12/30/13
 Best:          33w 0d     Det. By:  Early Ultrasound         EDD:   12/19/13
Anatomy

 Cranium:          Appears normal         Aortic Arch:      Not well visualized
 Fetal Cavum:      Appears normal         Ductal Arch:      Not well visualized
 Ventricles:       Appears normal         Diaphragm:        Appears normal
 Choroid Plexus:   Appears normal         Stomach:          Appears normal, left
                                                            sided
 Cerebellum:       Appears normal         Abdomen:          Appears normal
 Posterior Fossa:  Appears normal         Abdominal Wall:   Not well visualized
 Nuchal Fold:      Not applicable (>20    Cord Vessels:     Appears normal (3
                   wks GA)                                  vessel cord)
 Face:             Appears normal         Kidneys:          Appear normal
                   (orbits and profile)
 Lips:             Appears normal         Bladder:          Appears normal
 Palate:           Appears normal         Spine:            Appears normal
 Heart:            Appears normal         Lower             Appears normal
                   (4CH, axis, and        Extremities:
                   situs)
 RVOT:             Not well visualized    Upper             Appears normal
                                          Extremities:
 LVOT:             Not well visualized
Doppler - Fetal Vessels

 Umbilical Artery
 S/D:   2.59           49  %tile       RI:
 PI:    0.92                           PSV:       57.6    cm/s

Impression

 IUP at 33+0 weeks
 Normal detailed fetal anatomy; limited views of the heart and
 CI
 Normal amniotic fluid volume
 Measurements consistent with prior US; EFW at the 19th
 %tile; AC < 3rd %tile
 UA dopplers were normal for this GA
Recommendations

 Weekly BPPs and UA dopplers
 Growth US in 3 weeks

 questions or concerns.
                 Attending Physician, RISE

## 2019-01-01 ENCOUNTER — Ambulatory Visit: Payer: BC Managed Care – PPO | Admitting: Adult Health

## 2021-01-05 ENCOUNTER — Encounter: Payer: Self-pay | Admitting: Nurse Practitioner

## 2021-01-05 ENCOUNTER — Other Ambulatory Visit: Payer: Self-pay

## 2021-01-05 ENCOUNTER — Ambulatory Visit (INDEPENDENT_AMBULATORY_CARE_PROVIDER_SITE_OTHER): Payer: BC Managed Care – PPO | Admitting: Nurse Practitioner

## 2021-01-05 VITALS — BP 111/71 | HR 81 | Temp 97.9°F | Ht 67.0 in | Wt 257.3 lb

## 2021-01-05 DIAGNOSIS — Z7689 Persons encountering health services in other specified circumstances: Secondary | ICD-10-CM | POA: Diagnosis not present

## 2021-01-05 DIAGNOSIS — N926 Irregular menstruation, unspecified: Secondary | ICD-10-CM | POA: Diagnosis not present

## 2021-01-05 DIAGNOSIS — R5383 Other fatigue: Secondary | ICD-10-CM | POA: Diagnosis not present

## 2021-01-05 DIAGNOSIS — M722 Plantar fascial fibromatosis: Secondary | ICD-10-CM | POA: Diagnosis not present

## 2021-01-05 NOTE — Progress Notes (Signed)
New Patient Office Visit  Subjective:  Patient ID: Alexandra May, female    DOB: 1986/02/16  Age: 35 y.o. MRN: [id number]  CC:  Chief Complaint  Patient presents with  . New Patient (Initial Visit)    HPI Alexandra May presents to establish new primary care provider. The office she had been going to has closed and she needs to establish a new PCP. The patient is due to have routine, fasting labs done. She does have family history of high cholesterol and hypertension. Has not had Fasting blood work done in several years. She does see a GYN provider. Her last pap smear was done 04/2020 and was normal. The patient states that her menstrual cycles have been irregular and very heavy. The patient does not wish to go on oral contraceptives or hormones. Her GYN has recommended she have endometrial ablation to help with the heavy bleeding. The patient is c/o moderate fatigue. She does have a very busy lifestyle. 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 With heavy periods, we discussed that heavy bleeding could cause iron deficiency anemia or there may be hormonal abnormalities. Will add these labs to order for fasting blood work.  The patient is currently dealing with plantar fasciitis. She does see orthopedic provider for this. She recently got cortisone injections into her feet to help. She got no relief. Orthopedic provider feels like she needs to have surgery. Will need to consult with podiatry. Has follow up with orthopedics in next few weeks to discuss this further.  The patient denies chest pain, chest pressure, shortness of breath, headaches, or visual changes. She denies nausea, vomiting, diarrhea, or abdominal pain/discomfort.   Past Medical History:  Diagnosis Date  . H/O pre-eclampsia in prior pregnancy, currently pregnant   . Pregnancy induced hypertension   . Preterm labor     Past Surgical History:  Procedure Laterality Date  . CESAREAN  SECTION    . CESAREAN SECTION N/A 11/02/2013   Procedure: CESAREAN SECTION;  Surgeon: Freddrick March. Tenny Craw, MD;  Location: [facility];  Service: Obstetrics;  Laterality: N/A;  . DILATION AND EVACUATION N/A 01/16/2013   Procedure: DILATATION AND EVACUATION;  Surgeon: Loney Laurence, MD;  Location: [facility];  Service: Gynecology;  Laterality: N/A;  . KNEE SURGERY  2011  . WISDOM TOOTH EXTRACTION  2007    Family History  Problem Relation Age of Onset  . Hyperlipidemia Mother   . Hyperlipidemia Father   . Hypertension Father   . Miscarriages / Stillbirths Sister   . Asthma Brother   . Cancer Maternal Grandmother   . COPD Maternal Grandmother   . Stroke Maternal Grandmother   . Varicose Veins Maternal Grandmother     Social History   Socioeconomic History  . Marital status: Married    Spouse name: Not on file  . Number of children: Not on file  . Years of education: Not on file  . Highest education level: Not on file  Occupational History  . Not on file  Tobacco Use  . Smoking status: Never Smoker  . Smokeless tobacco: Never Used  Substance and Sexual Activity  . Alcohol use: Yes  . Drug use: No  . Sexual activity: Yes    Birth control/protection: None  Other Topics Concern  . Not on file  Social History Narrative  . Not on file   Social Determinants of Health   Financial Resource Strain: Not on  file  Food Insecurity: Not on file  Transportation Needs: Not on file  Physical Activity: Not on file  Stress: Not on file  Social Connections: Not on file  Intimate Partner Violence: Not on file    ROS Review of Systems  Constitutional: Positive for fatigue. Negative for activity change, chills and fever.  HENT: Negative for congestion and sinus pain.   Respiratory: Negative for cough and wheezing.   Cardiovascular: Negative for chest pain and palpitations.  Gastrointestinal: Negative for constipation, diarrhea, nausea and vomiting.  Endocrine: Negative for cold intolerance,  heat intolerance, polydipsia and polyuria.  Genitourinary: Positive for menstrual problem and vaginal bleeding.  Musculoskeletal: Positive for arthralgias. Negative for back pain and myalgias.       Bilateral foot pain.   Skin: Negative for rash.  Allergic/Immunologic: Negative for environmental allergies.  Neurological: Negative for dizziness, weakness and headaches.  Psychiatric/Behavioral: Negative for dysphoric mood. The patient is not nervous/anxious.   All other systems reviewed and are negative.   Objective:   Today's Vitals   01/05/21 0833  BP: 111/71  Pulse: 81  Temp: 97.9 F (36.6 C)  SpO2: 100%  Weight: 257 lb 4.8 oz (116.7 kg)  Height: 5\' 7"  (1.702 m)   Body mass index is 40.3 kg/m.    Physical Exam Vitals and nursing note reviewed.  Constitutional:      Appearance: Normal appearance. She is well-developed.  HENT:     Head: Normocephalic and atraumatic.     Nose: Nose normal.  Eyes:     Pupils: Pupils are equal, round, and reactive to light.  Cardiovascular:     Rate and Rhythm: Normal rate and regular rhythm.     Pulses: Normal pulses.     Heart sounds: Normal heart sounds.  Pulmonary:     Effort: Pulmonary effort is normal.     Breath sounds: Normal breath sounds.  Abdominal:     Palpations: Abdomen is soft.  Musculoskeletal:        General: Normal range of motion.     Cervical back: Normal range of motion and neck supple.  Skin:    General: Skin is warm and dry.  Neurological:     General: No focal deficit present.     Mental Status: She is alert and oriented to person, place, and time.  Psychiatric:        Mood and Affect: Mood normal.        Behavior: Behavior normal.        Thought Content: Thought content normal.        Judgment: Judgment normal.     Assessment & Plan:  1. Encounter to establish care Appointment today to establish new primary care provider   2. Other fatigue Will check T3, free T4, B12, ferritin, and vitamin d level  when labs drawn.   3. Irregular periods/menstrual cycles Check FSH, LH, and estradiol levels with labs.   4. Plantar fasciitis, bilateral Continue visits with orthopedics as scheduled.    Problem List Items Addressed This Visit      Musculoskeletal and Integument   Plantar fasciitis, bilateral     Other   Encounter to establish care - Primary   Other fatigue   Irregular periods/menstrual cycles      Outpatient Encounter Medications as of 01/05/2021  Medication Sig  . [DISCONTINUED] oxyCODONE-acetaminophen (PERCOCET/ROXICET) 5-325 MG per tablet Take 1-2 tablets by mouth every 4 (four) hours as needed for severe pain (moderate - severe pain).  . [DISCONTINUED]  Prenatal Vit-Fe Fumarate-FA (PRENATAL MULTIVITAMIN) TABS Take 1 tablet by mouth daily at 12 noon.  . [DISCONTINUED] labetalol (NORMODYNE) 100 MG tablet Take 1 tablet (100 mg total) by mouth 2 (two) times daily. (Patient not taking: Reported on 01/05/2021)   No facility-administered encounter medications on file as of 01/05/2021.   Time spent with the patient was approximately 30 minutes. This time included reviewing progress notes, labs, imaging studies, and discussing plan for follow up.   Follow-up: Return in about 6 months (around 07/08/2021) for fbw in next week - add T3, freet4, B12, ferritin, vit D, FSH, LH, and estradiol .   Carlean Jews, NP

## 2021-01-05 NOTE — Patient Instructions (Signed)
Menorrhagia Menorrhagia is when your monthly periods are heavy or last longer than normal. If you have this condition, bleeding and cramping may make it hard for you to do your daily activities. What are the causes? Common causes of this condition include:  Growths in the womb (uterus). These are polyps or fibroids. These growths are not cancer.  Problems with two hormones called estrogen and progesterone.  One of the ovaries not releasing an egg during one or more months.  A problem with the thyroid gland.  Having a device for birth control (IUD).  Side effects of some medicines, such as NSAIDs or blood thinners.  A disorder that stops the blood from clotting normally. What increases the risk? You are more likely to have this condition if you have cancer of the womb. What are the signs or symptoms?  Having to change your pad or tampon every 1-2 hours because it is soaked.  Needing to use pads and tampons at the same time because of heavy bleeding.  Needing to wake up to change your pads or tampons during the night.  Passing blood clots larger than 1 inch (2.5 cm) in size.  Having bleeding that lasts for more than 7 days.  Having symptoms of low iron levels (anemia), such as feeling tired or having shortness of breath. How is this treated? You may not need to be treated for this condition. But if you need treatment, you may be given medicines:  To reduce bleeding during your period. These include birth control medicines.  To make your blood thick. This slows bleeding.  To reduce swelling. Medicines that do this include ibuprofen.  That have a hormone called progestin.  That make the ovaries stop working for a short time.  To treat low iron levels. You will be given iron pills if you have this condition. If medicines do not work, surgery may be done. Surgery may be done to:  Remove a part of the lining of the womb. This lining is called the endometrium. This reduces  bleeding during a period.  Remove growths in the womb. These may be polyps or fibroids.  Remove the entire lining of the womb.  Remove the womb entirely. This procedure is called a hysterectomy.   Follow these instructions at home: Medicines  Take over-the-counter and prescription medicines only as told by your doctor. This includes iron pills.  Do not change or switch medicines without asking your doctor.  Do not take aspirin or medicines that contain aspirin 1 week before or during your period. Aspirin may make bleeding worse. Managing constipation Iron pills may cause trouble pooping (constipation). To prevent or treat problems when pooping, you may need to:  Drink enough fluid to keep your pee (urine) pale yellow.  Take over-the-counter or prescription medicines.  Eat foods that are high in fiber. These include beans, whole grains, and fresh fruits and vegetables.  Limit foods that are high in fat and sugar. These include fried or sweet foods. General instructions  If you need to change your pad or tampon more than once every 2 hours, limit your activity until the bleeding stops.  Eat healthy meals and foods that are high in iron. Foods that have a lot of iron include: ? Leafy green vegetables. ? Meat. ? Liver. ? Eggs. ? Whole-grain breads and cereals.  Do not try to lose weight until your heavy bleeding has stopped and you have normal amounts of iron in your blood. If you need to lose   weight, work with your doctor.  Keep all follow-up visits. Contact a doctor if:  You soak through a pad or tampon every 1 or 2 hours, and this happens every time you have a period.  You need to use pads and tampons at the same time because you are bleeding so much.  You are taking medicine, and: ? You feel like you may vomit. ? You vomit. ? You have watery poop (diarrhea).  You have other problems that may be related to the medicine you are taking. Get help right away if:  You  soak through more than a pad or tampon in 1 hour.  You pass clots bigger than 1 inch (2.5 cm) wide.  You feel short of breath.  You feel like your heart is beating too fast.  You feel dizzy or you faint.  You feel very weak or tired. Summary  Menorrhagia is when your menstrual periods are heavy or last longer than normal.  You may not need to be treated for this condition. If you need treatment, you may be given medicines or have surgery.  Take over-the-counter and prescription medicines only as told by your doctor. This includes iron pills.  Get help right away if you soak through more than a pad or tampon in 1 hour or you pass large clots. Also, get help right away if you feel dizzy, short of breath, or very weak or tired. This information is not intended to replace advice given to you by your health care provider. Make sure you discuss any questions you have with your health care provider. Document Revised: 06/09/2020 Document Reviewed: 06/09/2020 Elsevier Patient Education  2021 [employer]  

## 2021-01-07 ENCOUNTER — Other Ambulatory Visit: Payer: Self-pay | Admitting: Nurse Practitioner

## 2021-01-07 DIAGNOSIS — R5383 Other fatigue: Secondary | ICD-10-CM

## 2021-01-07 DIAGNOSIS — N926 Irregular menstruation, unspecified: Secondary | ICD-10-CM

## 2021-01-07 DIAGNOSIS — Z0000 Encounter for general adult medical examination without abnormal findings: Secondary | ICD-10-CM

## 2021-01-07 NOTE — Progress Notes (Signed)
fbw in next week - add T3, freet4, B12, ferritin, vit D, FSH, LH, and estradiol .

## 2021-01-15 ENCOUNTER — Other Ambulatory Visit: Payer: BC Managed Care – PPO

## 2021-01-15 ENCOUNTER — Other Ambulatory Visit: Payer: Self-pay

## 2021-01-15 DIAGNOSIS — N926 Irregular menstruation, unspecified: Secondary | ICD-10-CM

## 2021-01-15 DIAGNOSIS — Z0000 Encounter for general adult medical examination without abnormal findings: Secondary | ICD-10-CM

## 2021-01-15 DIAGNOSIS — R5383 Other fatigue: Secondary | ICD-10-CM

## 2021-01-16 LAB — CBC
Hematocrit: 39.4 % (ref 34.0–46.6)
Hemoglobin: 13.3 g/dL (ref 11.1–15.9)
MCH: 30.8 pg (ref 26.6–33.0)
MCHC: 33.8 g/dL (ref 31.5–35.7)
MCV: 91 fL (ref 79–97)
Platelets: 240 10*3/uL (ref 150–450)
RBC: 4.32 x10E6/uL (ref 3.77–5.28)
RDW: 12.2 % (ref 11.7–15.4)
WBC: 5.9 10*3/uL (ref 3.4–10.8)

## 2021-01-16 LAB — FERRITIN: Ferritin: 49 ng/mL (ref 15–150)

## 2021-01-16 LAB — T3: T3, Total: 105 ng/dL (ref 71–180)

## 2021-01-16 LAB — FOLLICLE STIMULATING HORMONE: FSH: 7.1 m[IU]/mL

## 2021-01-16 LAB — COMPREHENSIVE METABOLIC PANEL
ALT: 10 IU/L (ref 0–32)
AST: 15 IU/L (ref 0–40)
Albumin/Globulin Ratio: 1.9 (ref 1.2–2.2)
Albumin: 4.5 g/dL (ref 3.8–4.8)
Alkaline Phosphatase: 44 IU/L (ref 44–121)
BUN/Creatinine Ratio: 16 (ref 9–23)
BUN: 14 mg/dL (ref 6–20)
Bilirubin Total: 0.4 mg/dL (ref 0.0–1.2)
CO2: 23 mmol/L (ref 20–29)
Calcium: 9.3 mg/dL (ref 8.7–10.2)
Chloride: 105 mmol/L (ref 96–106)
Creatinine, Ser: 0.88 mg/dL (ref 0.57–1.00)
Globulin, Total: 2.4 g/dL (ref 1.5–4.5)
Glucose: 81 mg/dL (ref 65–99)
Potassium: 4.3 mmol/L (ref 3.5–5.2)
Sodium: 141 mmol/L (ref 134–144)
Total Protein: 6.9 g/dL (ref 6.0–8.5)
eGFR: 87 mL/min/{1.73_m2} (ref >59)

## 2021-01-16 LAB — LUTEINIZING HORMONE: LH: 5.4 m[IU]/mL

## 2021-01-16 LAB — LIPID PANEL
Chol/HDL Ratio: 2.8 ratio (ref 0.0–4.4)
Cholesterol, Total: 194 mg/dL (ref 100–199)
HDL: 69 mg/dL (ref >39)
LDL Chol Calc (NIH): 111 mg/dL — ABNORMAL HIGH (ref 0–99)
Triglycerides: 79 mg/dL (ref 0–149)
VLDL Cholesterol Cal: 14 mg/dL (ref 5–40)

## 2021-01-16 LAB — HEMOGLOBIN A1C
Est. average glucose Bld gHb Est-mCnc: 97 mg/dL
Hgb A1c MFr Bld: 5 % (ref 4.8–5.6)

## 2021-01-16 LAB — T4, FREE: Free T4: 1.47 ng/dL (ref 0.82–1.77)

## 2021-01-16 LAB — TSH: TSH: 0.84 u[IU]/mL (ref 0.450–4.500)

## 2021-01-16 LAB — B12 AND FOLATE PANEL
Folate: 5.2 ng/mL (ref >3.0)
Vitamin B-12: 487 pg/mL (ref 232–1245)

## 2021-01-16 LAB — VITAMIN D 25 HYDROXY (VIT D DEFICIENCY, FRACTURES): Vit D, 25-Hydroxy: 29 ng/mL — ABNORMAL LOW (ref 30.0–100.0)

## 2021-01-16 LAB — ESTRADIOL: Estradiol: 146 pg/mL

## 2021-01-28 ENCOUNTER — Ambulatory Visit: Payer: BC Managed Care – PPO | Admitting: Podiatry

## 2021-01-28 ENCOUNTER — Ambulatory Visit (INDEPENDENT_AMBULATORY_CARE_PROVIDER_SITE_OTHER): Payer: BC Managed Care – PPO

## 2021-01-28 ENCOUNTER — Encounter: Payer: Self-pay | Admitting: Podiatry

## 2021-01-28 ENCOUNTER — Other Ambulatory Visit: Payer: Self-pay

## 2021-01-28 DIAGNOSIS — M722 Plantar fascial fibromatosis: Secondary | ICD-10-CM

## 2021-01-28 DIAGNOSIS — O039 Complete or unspecified spontaneous abortion without complication: Secondary | ICD-10-CM | POA: Insufficient documentation

## 2021-01-28 MED ORDER — MELOXICAM 15 MG PO TABS: 15.0000 mg | ORAL_TABLET | Freq: Every day | ORAL | 3 refills | Status: DC

## 2021-01-28 MED ORDER — TRIAMCINOLONE ACETONIDE 40 MG/ML IJ SUSP
20.0000 mg | Freq: Once | INTRAMUSCULAR | Status: AC
  Administered 2021-01-28: 20 mg

## 2021-01-28 MED ORDER — METHYLPREDNISOLONE 4 MG PO TBPK: ORAL_TABLET | ORAL | 0 refills | Status: DC

## 2021-01-28 NOTE — Progress Notes (Signed)
Hey. Please let the patient know that I have reviewed her labs. She has mild vitamin d deficiency.  I recommend she take OTC vitamin d 2000 units every day. Also, bad cholesterol was just a little too high. She should limit her intake of fried and fatty foods in her diet and increase her physical activity. All other labs were good. Thanks.

## 2021-01-30 NOTE — Progress Notes (Signed)
  Subjective:  Patient ID: Alexandra May, female    DOB: April 15, 1986,  MRN: [id number] HPI Chief Complaint  Patient presents with  . Foot Pain    Plantar heel right - aching x 3 years, AM pain, saw Dr. Lily Peer, injected-said needed surgery, referred here  . New Patient (Initial Visit)    35 y.o. female presents with the above complaint.   ROS: Denies fever chills nausea vomiting muscle aches pains calf pain back pain chest pain shortness of breath.  Past Medical History:  Diagnosis Date  . H/O pre-eclampsia in prior pregnancy, currently pregnant   . Pregnancy induced hypertension   . Preterm labor    Past Surgical History:  Procedure Laterality Date  . CESAREAN SECTION    . CESAREAN SECTION N/A 11/02/2013   Procedure: CESAREAN SECTION;  Surgeon: Freddrick March. Tenny Craw, MD;  Location: [facility];  Service: Obstetrics;  Laterality: N/A;  . DILATION AND EVACUATION N/A 01/16/2013   Procedure: DILATATION AND EVACUATION;  Surgeon: Loney Laurence, MD;  Location: [facility];  Service: Gynecology;  Laterality: N/A;  . KNEE SURGERY  2011  . WISDOM TOOTH EXTRACTION  2007    Current Outpatient Medications:  .  meloxicam (MOBIC) 15 MG tablet, Take 1 tablet (15 mg total) by mouth daily., Disp: 30 tablet, Rfl: 3 .  methylPREDNISolone (MEDROL DOSEPAK) 4 MG TBPK tablet, 6 day dose pack - take as directed, Disp: 21 tablet, Rfl: 0  Allergies  Allergen Reactions  . Penicillins Other (See Comments)    Childhood reaction  . Adhesive [Tape] Rash   Review of Systems Objective:  There were no vitals filed for this visit.  General: Well developed, nourished, in no acute distress, alert and oriented x3   Dermatological: Skin is warm, dry and supple bilateral. Nails x 10 are well maintained; remaining integument appears unremarkable at this time. There are no open sores, no preulcerative lesions, no rash or signs of infection present.  Vascular: Dorsalis Pedis artery and Posterior Tibial artery pedal pulses  are 2/4 bilateral with immedate capillary fill time. Pedal hair growth present. No varicosities and no lower extremity edema present bilateral.   Neruologic: Grossly intact via light touch bilateral. Vibratory intact via tuning fork bilateral. Protective threshold with Semmes Wienstein monofilament intact to all pedal sites bilateral. Patellar and Achilles deep tendon reflexes 2+ bilateral. No Babinski or clonus noted bilateral.   Musculoskeletal: No gross boney pedal deformities bilateral. No pain, crepitus, or limitation noted with foot and ankle range of motion bilateral. Muscular strength 5/5 in all groups tested bilateral.  Pain to palpation mid calcaneal tubercle of the right heel.  Gait: Unassisted, Nonantalgic.    Radiographs:  Radiographs taken today demonstrate soft tissue increase in density plantar fascial kidney insertion site of the right heel no acute findings noted.  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  Multiple treatments have failed.  Plan: Discussed etiology pathology conservative surgical therapies would like to try 1 more attempted conservative therapies consisting of a plantar fascial injection 20 mg Kenalog 5 mg Marcaine point of maximal tenderness.  Started her on methylprednisolone to be followed by meloxicam.  Discussed appropriate shoe gear stretching exercise ice therapy and shoe gear modifications.  Dispensed a plantar fascial brace and she already has a night splint.  Schedule her for orthotic casting and she will return to clinic in about 6 weeks     Gunnard Dorrance T. [place], [place]

## 2021-02-08 ENCOUNTER — Other Ambulatory Visit: Payer: Self-pay

## 2021-02-08 ENCOUNTER — Ambulatory Visit (INDEPENDENT_AMBULATORY_CARE_PROVIDER_SITE_OTHER): Payer: BC Managed Care – PPO | Admitting: *Deleted

## 2021-02-08 DIAGNOSIS — M722 Plantar fascial fibromatosis: Secondary | ICD-10-CM

## 2021-02-08 NOTE — Progress Notes (Signed)
Patient presents today to be casted for custom molded orthotics. Dr. Al Corpus has been treating patient for plantar fasciitis.   Impression foam cast was taken. ABN signed.  Patient info-  Shoe size: 10 Womens  Shoe style: Sneakers  Height: 5'7   Weight: 250   Patient would like to check her benefits prior to ordering these. I give her the form to assist in checking with her insurance. We will keep the molds on hold and not send these out until she contacts [place] with how she'd like to proceed.

## 2021-02-10 ENCOUNTER — Telehealth: Payer: Self-pay | Admitting: Podiatry

## 2021-02-10 NOTE — Telephone Encounter (Signed)
Pt left message yesterday 5.3.2022 @ 431pm  with additional questions about the orthotics she was measured for and we have on hold until we hear back from pt.  I returned call today and left message for pt to call me on my direct number.

## 2021-03-11 ENCOUNTER — Other Ambulatory Visit: Payer: Self-pay

## 2021-03-11 ENCOUNTER — Encounter: Payer: Self-pay | Admitting: Podiatry

## 2021-03-11 ENCOUNTER — Ambulatory Visit: Payer: BC Managed Care – PPO | Admitting: Podiatry

## 2021-03-11 DIAGNOSIS — M722 Plantar fascial fibromatosis: Secondary | ICD-10-CM | POA: Diagnosis not present

## 2021-03-11 MED ORDER — TRIAMCINOLONE ACETONIDE 40 MG/ML IJ SUSP
20.0000 mg | Freq: Once | INTRAMUSCULAR | Status: AC
  Administered 2021-03-11: 20 mg

## 2021-03-13 NOTE — Progress Notes (Signed)
She presents today for follow-up of her Planter fasciitis states that she still has a little bit of pain little little bit of soreness but is much better than it was previously.  She states that is probably about 60% better  Objective: Vital signs are stable alert oriented x3 pulses are palpable.  She has pain on palpation medial calcaneal tubercle to the right heel.  Assessment: Planter fasciitis 60% resolved.  Plan: Reinjected the right heel today we will continue with the oral anti-inflammatories plantar fascial brace and shoe gear changes.  Follow-up with me in 1 month to 6 weeks.

## 2021-04-15 ENCOUNTER — Encounter: Payer: Self-pay | Admitting: Podiatry

## 2021-04-15 ENCOUNTER — Ambulatory Visit: Payer: BC Managed Care – PPO | Admitting: Podiatry

## 2021-04-15 ENCOUNTER — Other Ambulatory Visit: Payer: Self-pay

## 2021-04-15 DIAGNOSIS — M722 Plantar fascial fibromatosis: Secondary | ICD-10-CM | POA: Diagnosis not present

## 2021-04-15 MED ORDER — CELECOXIB 100 MG PO CAPS
100.0000 mg | ORAL_CAPSULE | Freq: Two times a day (BID) | ORAL | 3 refills | Status: DC
Start: 2021-04-15 — End: 2021-08-19

## 2021-04-15 MED ORDER — TRIAMCINOLONE ACETONIDE 40 MG/ML IJ SUSP
40.0000 mg | Freq: Once | INTRAMUSCULAR | Status: AC
  Administered 2021-04-15: 40 mg

## 2021-04-16 NOTE — Progress Notes (Signed)
She presents today for follow-up of her Planter fasciitis.  States that both of them seem to be hurting today for some reason.  She was 60% better the last time I saw her and we had reinjected her at that time.  That was just the beginning of last month.  Objective: Vital signs are stable she is alert and oriented x3.  Pulses are palpable.  Neurologic sensorium is intact.  Deep tendon reflexes are intact.  Muscle strength is normal symmetrical.  She still has pain on palpation medial calcaneal tubercles bilaterally.  She has no pain on medial lateral compression of the calcaneus no pain on palpation of the Achilles tendon over the lateral aspect of the foot.  Assessment: Chronic intractable Planter fasciitis.  Plan: At this point I reinjected today bilaterally 20 mg Kenalog 5 mg Marcaine point of maximal tenderness bilateral.  Also admixed with Celestone.  Also changed her meloxicam to Celebrex 100 mg twice daily.  Explained to her that she can take up to 200 mg twice a day if necessary for severe pain.  She still has not purchased orthotics yet.  She will return to clinic as needed.

## 2021-08-19 ENCOUNTER — Ambulatory Visit: Payer: BC Managed Care – PPO | Admitting: Physician Assistant

## 2021-08-19 ENCOUNTER — Encounter: Payer: Self-pay | Admitting: Physician Assistant

## 2021-08-19 ENCOUNTER — Other Ambulatory Visit: Payer: Self-pay

## 2021-08-19 VITALS — BP 117/71 | HR 68 | Temp 98.3°F | Ht 67.0 in | Wt 260.0 lb

## 2021-08-19 DIAGNOSIS — J0140 Acute pansinusitis, unspecified: Secondary | ICD-10-CM

## 2021-08-19 MED ORDER — METHYLPREDNISOLONE 4 MG PO TBPK: ORAL_TABLET | ORAL | 0 refills | Status: DC

## 2021-08-19 MED ORDER — AZITHROMYCIN 250 MG PO TABS: ORAL_TABLET | ORAL | 0 refills | Status: AC

## 2021-08-19 NOTE — Progress Notes (Signed)
Acute Office Visit  Subjective:    Patient ID: Alexandra May, female    DOB: 10/10/1986, 35 y.o.   MRN: [id number]  Chief Complaint  Patient presents with   Acute Visit   Nasal Congestion   Cough    HPI Patient is in today for c/o nasal congestion, unilateral headache, sinus pressure, postnasal drainage which can trigger a cough. Symptoms started last week 08/09/21. Has tried pseudoephedrine, Nyquil and Zicam. States Zicam gave her palpitations so stopped medication. Sore throat and body aches have resolved. Denies fever, n/v/d, runny nose shortness of breath, wheezing or chills.    Past Medical History:  Diagnosis Date   H/O pre-eclampsia in prior pregnancy, currently pregnant    Pregnancy induced hypertension    Preterm labor     Past Surgical History:  Procedure Laterality Date   CESAREAN SECTION     CESAREAN SECTION N/A 11/02/2013   Procedure: CESAREAN SECTION;  Surgeon: Freddrick March. Tenny Craw, MD;  Location: [facility];  Service: Obstetrics;  Laterality: N/A;   DILATION AND EVACUATION N/A 01/16/2013   Procedure: DILATATION AND EVACUATION;  Surgeon: Loney Laurence, MD;  Location: [facility];  Service: Gynecology;  Laterality: N/A;   KNEE SURGERY  2011   WISDOM TOOTH EXTRACTION  2007    Family History  Problem Relation Age of Onset   Hyperlipidemia Mother    Hyperlipidemia Father    Hypertension Father    Miscarriages / Stillbirths Sister    Asthma Brother    Cancer Maternal Grandmother    COPD Maternal Grandmother    Stroke Maternal Grandmother    Varicose Veins Maternal Grandmother     Social History   Socioeconomic History   Marital status: Married    Spouse name: Not on file   Number of children: Not on file   Years of education: Not on file   Highest education level: Not on file  Occupational History   Not on file  Tobacco Use   Smoking status: Never   Smokeless tobacco: Never  Substance and Sexual Activity   Alcohol use: Yes   Drug use: No   Sexual  activity: Yes    Birth control/protection: None  Other Topics Concern   Not on file  Social History Narrative   Not on file   Social Determinants of Health   Financial Resource Strain: Not on file  Food Insecurity: Not on file  Transportation Needs: Not on file  Physical Activity: Not on file  Stress: Not on file  Social Connections: Not on file  Intimate Partner Violence: Not on file    Outpatient Medications Prior to Visit  Medication Sig Dispense Refill   meloxicam (MOBIC) 15 MG tablet Take 15 mg by mouth 2 (two) times daily.     celecoxib (CELEBREX) 100 MG capsule Take 1 capsule (100 mg total) by mouth 2 (two) times daily. 60 capsule 3   traMADol (ULTRAM) 50 MG tablet Take 50 mg by mouth every 6 (six) hours as needed.     No facility-administered medications prior to visit.    Allergies  Allergen Reactions   Penicillins Other (See Comments)    Childhood reaction   Adhesive [Tape] Rash    Review of Systems Review of Systems:  A fourteen system review of systems was performed and found to be positive as per HPI.  Objective:    Physical Exam General:  Well Developed, well nourished, appropriate for stated age.  Neuro:  Alert and oriented,  extra-ocular muscles  intact  HEENT:  Normocephalic, atraumatic, tenderness of frontal, maxillary and ethmoid sinus, PERRL, normal TM's of both ears with some fluid, boggy turbinates, erythema of posterior oropharynx, neck supple, +cervical adenopathy (right submandibular gland) Skin:  no gross rash, warm, pink. Cardiac:  RRR Respiratory:  CTA B/L, Not using accessory muscles, speaking in full sentences- unlabored. Vascular:  Ext warm, no cyanosis apprec.; cap RF less 2 sec. Psych:  No HI/SI, judgement and insight good, Euthymic mood. Full Affect.  BP 117/71   Pulse 68   Temp 98.3 F (36.8 C)   Ht $R'5\' 7"'Ru$  (1.702 m)   Wt 260 lb (117.9 kg)   SpO2 99%   BMI 40.72 kg/m  Wt Readings from Last 3 Encounters:  08/19/21 260 lb  (117.9 kg)  01/05/21 257 lb 4.8 oz (116.7 kg)  11/05/13 261 lb 8 oz (118.6 kg)    Health Maintenance Due  Topic Date Due   COVID-19 Vaccine (1) Never done   INFLUENZA VACCINE  05/10/2021    There are no preventive care reminders to display for this patient.   Lab Results  Component Value Date   TSH 0.840 01/15/2021   Lab Results  Component Value Date   WBC 5.9 01/15/2021   HGB 13.3 01/15/2021   HCT 39.4 01/15/2021   MCV 91 01/15/2021   PLT 240 01/15/2021   Lab Results  Component Value Date   NA 141 01/15/2021   K 4.3 01/15/2021   CO2 23 01/15/2021   GLUCOSE 81 01/15/2021   BUN 14 01/15/2021   CREATININE 0.88 01/15/2021   BILITOT 0.4 01/15/2021   ALKPHOS 44 01/15/2021   AST 15 01/15/2021   ALT 10 01/15/2021   PROT 6.9 01/15/2021   ALBUMIN 4.5 01/15/2021   CALCIUM 9.3 01/15/2021   EGFR 87 01/15/2021   Lab Results  Component Value Date   CHOL 194 01/15/2021   Lab Results  Component Value Date   HDL 69 01/15/2021   Lab Results  Component Value Date   LDLCALC 111 (H) 01/15/2021   Lab Results  Component Value Date   TRIG 79 01/15/2021   Lab Results  Component Value Date   CHOLHDL 2.8 01/15/2021   Lab Results  Component Value Date   HGBA1C 5.0 01/15/2021       Assessment & Plan:   Problem List Items Addressed This Visit   None Visit Diagnoses     Acute non-recurrent pansinusitis    -  Primary   Relevant Medications   azithromycin (ZITHROMAX) 250 MG tablet   methylPREDNISolone (MEDROL DOSEPAK) 4 MG TBPK tablet      Patient has s/sx suggestive of sinusitis and symptoms ongoing >10 days so will start oral antibiotic therapy with azithromycin and corticosteroid therapy. Advised to continue with home supportive care. Monitor for worsening symptoms.  Meds ordered this encounter  Medications   azithromycin (ZITHROMAX) 250 MG tablet    Sig: Take 2 tablets on day 1, then 1 tablet daily on days 2 through 5    Dispense:  6 tablet    Refill:  0     Order Specific Question:   Supervising Provider    Answer:   Beatrice Lecher D [[id number]]   methylPREDNISolone (MEDROL DOSEPAK) 4 MG TBPK tablet    Sig: Take as directed on package.    Dispense:  21 tablet    Refill:  0    Order Specific Question:   Supervising Provider    Answer:   Beatrice Lecher D [[id number]]  Lorrene Reid, PA-C

## 2021-08-19 NOTE — Patient Instructions (Signed)

## 2022-02-28 ENCOUNTER — Encounter: Payer: Self-pay | Admitting: Nurse Practitioner

## 2022-02-28 ENCOUNTER — Ambulatory Visit (INDEPENDENT_AMBULATORY_CARE_PROVIDER_SITE_OTHER): Payer: BC Managed Care – PPO | Admitting: Nurse Practitioner

## 2022-02-28 VITALS — BP 118/75 | HR 64 | Temp 97.9°F | Ht 66.93 in | Wt 258.8 lb

## 2022-02-28 DIAGNOSIS — E559 Vitamin D deficiency, unspecified: Secondary | ICD-10-CM | POA: Diagnosis not present

## 2022-02-28 DIAGNOSIS — R5383 Other fatigue: Secondary | ICD-10-CM | POA: Diagnosis not present

## 2022-02-28 DIAGNOSIS — R635 Abnormal weight gain: Secondary | ICD-10-CM | POA: Diagnosis not present

## 2022-02-28 DIAGNOSIS — Z0001 Encounter for general adult medical examination with abnormal findings: Secondary | ICD-10-CM

## 2022-02-28 NOTE — Progress Notes (Signed)
Complete physical exam   Patient: Alexandra May   DOB: December 20, 1985   36 y.o. Female  MRN: [id number] Visit Date: 02/28/2022    Chief Complaint  Patient presents with   Annual Exam   Subjective    Alexandra May is a 36 y.o. female who presents today for a complete physical exam.  She reports consuming a general diet. The patient does not participate in regular exercise at present. She generally feels fairly well. She does have additional problems to discuss today.   HPI  The patient states that she currently struggling with weight loss. States that she eats a lot on the go. Has two young boys who play baseball. Her husband is Recruitment consultant. She states that she has a hard time with activities that keep her on her feet. She has plantar fasciitis in her right foot. Very painful to walk or run. Able to do yoga.  Has tried keto diet. Tried cutting back calorie counts. Has tried low sugar. Has decreased carbohydrates. Gets to certain point and then can't get passed it.  She denies other concerns or complaints. She denies chest pain, chest pressure, or shortness of breath. She denies headaches or visual disturbances. She denies abdominal pain, nausea, vomiting, or changes in bowel or bladder habits.     Past Medical History:  Diagnosis Date   H/O pre-eclampsia in prior pregnancy, currently pregnant    Pregnancy induced hypertension    Preterm labor    Past Surgical History:  Procedure Laterality Date   CESAREAN SECTION     CESAREAN SECTION N/A 11/02/2013   Procedure: CESAREAN SECTION;  Surgeon: Farrel Gobble. Harrington Challenger, MD;  Location: [facility];  Service: Obstetrics;  Laterality: N/A;   DILATION AND EVACUATION N/A 01/16/2013   Procedure: DILATATION AND EVACUATION;  Surgeon: Daria Pastures, MD;  Location: [facility];  Service: Gynecology;  Laterality: N/A;   KNEE SURGERY  2011   WISDOM TOOTH EXTRACTION  2007   Social History   Socioeconomic History   Marital status: Married    Spouse name: Not on file    Number of children: Not on file   Years of education: Not on file   Highest education level: Not on file  Occupational History   Not on file  Tobacco Use   Smoking status: Never   Smokeless tobacco: Never  Substance and Sexual Activity   Alcohol use: Yes   Drug use: No   Sexual activity: Yes    Birth control/protection: None  Other Topics Concern   Not on file  Social History Narrative   Not on file   Social Determinants of Health   Financial Resource Strain: Not on file  Food Insecurity: Not on file  Transportation Needs: Not on file  Physical Activity: Not on file  Stress: Not on file  Social Connections: Not on file  Intimate Partner Violence: Not on file   Family Status  Relation Name Status   Mother  (Not Specified)   Father  (Not Specified)   Sister  (Not Specified)   Brother  (Not Specified)   MGM  (Not Specified)   Family History  Problem Relation Age of Onset   Hyperlipidemia Mother    Hyperlipidemia Father    Hypertension Father    Miscarriages / Stillbirths Sister    Asthma Brother    Cancer Maternal Grandmother    COPD Maternal Grandmother    Stroke Maternal Grandmother    Varicose Veins Maternal Grandmother    Allergies  Allergen Reactions   Penicillins Other (See Comments)    Childhood reaction   Adhesive [Tape] Rash    Patient Care Team: Ronnell Freshwater, NP as PCP - General (Family Medicine)   Medications: Outpatient Medications Prior to Visit  Medication Sig   [DISCONTINUED] meloxicam (MOBIC) 15 MG tablet Take 15 mg by mouth 2 (two) times daily.   [DISCONTINUED] methylPREDNISolone (MEDROL DOSEPAK) 4 MG TBPK tablet Take as directed on package.   No facility-administered medications prior to visit.    Review of Systems  Constitutional:  Positive for fatigue. Negative for activity change, appetite change, chills and fever.  HENT:  Negative for congestion, postnasal drip, rhinorrhea, sinus pressure, sinus pain, sneezing and sore  throat.   Eyes: Negative.   Respiratory:  Negative for cough, chest tightness, shortness of breath and wheezing.   Cardiovascular:  Negative for chest pain and palpitations.  Gastrointestinal:  Negative for abdominal pain, constipation, diarrhea, nausea and vomiting.  Endocrine: Negative for cold intolerance, heat intolerance, polydipsia and polyuria.  Genitourinary:  Negative for dyspareunia, dysuria, flank pain, frequency and urgency.  Musculoskeletal:  Negative for arthralgias, back pain and myalgias.  Skin:  Negative for rash.  Allergic/Immunologic: Negative for environmental allergies.  Neurological:  Negative for dizziness, weakness and headaches.  Hematological:  Negative for adenopathy.  Psychiatric/Behavioral:  The patient is not nervous/anxious.       Objective     Today's Vitals   02/28/22 1625  BP: 118/75  Pulse: 64  Temp: 97.9 F (36.6 C)  SpO2: 100%  Weight: 258 lb 12.8 oz (117.4 kg)  Height: 5' 6.93" (1.7 m)   Body mass index is 40.62 kg/m.   BP Readings from Last 3 Encounters:  02/28/22 118/75  08/19/21 117/71  01/05/21 111/71    Wt Readings from Last 3 Encounters:  02/28/22 258 lb 12.8 oz (117.4 kg)  08/19/21 260 lb (117.9 kg)  01/05/21 257 lb 4.8 oz (116.7 kg)     Physical Exam Vitals and nursing note reviewed.  Constitutional:      Appearance: Normal appearance. She is well-developed.  HENT:     Head: Normocephalic and atraumatic.     Right Ear: Tympanic membrane, ear canal and external ear normal.     Left Ear: Tympanic membrane, ear canal and external ear normal.     Nose: Nose normal.     Mouth/Throat:     Mouth: Mucous membranes are moist.     Pharynx: Oropharynx is clear.  Eyes:     Extraocular Movements: Extraocular movements intact.     Conjunctiva/sclera: Conjunctivae normal.     Pupils: Pupils are equal, round, and reactive to light.  Cardiovascular:     Rate and Rhythm: Normal rate and regular rhythm.     Pulses: Normal pulses.      Heart sounds: Normal heart sounds.  Pulmonary:     Effort: Pulmonary effort is normal.     Breath sounds: Normal breath sounds.  Abdominal:     General: Bowel sounds are normal. There is no distension.     Palpations: Abdomen is soft. There is no mass.     Tenderness: There is no abdominal tenderness. There is no right CVA tenderness, left CVA tenderness, guarding or rebound.     Hernia: No hernia is present.  Musculoskeletal:        General: Normal range of motion.     Cervical back: Normal range of motion and neck supple.  Lymphadenopathy:     Cervical: No  cervical adenopathy.  Skin:    General: Skin is warm and dry.     Capillary Refill: Capillary refill takes less than 2 seconds.  Neurological:     General: No focal deficit present.     Mental Status: She is alert and oriented to person, place, and time.  Psychiatric:        Mood and Affect: Mood normal.        Behavior: Behavior normal.        Thought Content: Thought content normal.        Judgment: Judgment normal.     Last depression screening scores    02/28/2022    4:28 PM 08/19/2021    2:27 PM 01/05/2021    8:38 AM  PHQ 2/9 Scores  PHQ - 2 Score 0 0 0  PHQ- 9 Score 1 0 1   Last fall risk screening    08/19/2021    2:27 PM  [place] in the past year? 0  Number falls in past yr: 0  Injury with Fall? 0  Risk for fall due to : No Fall Risks  Follow up Falls evaluation completed   Last Audit-C alcohol use screening     View : No data to display.         A score of 3 or more in women, and 4 or more in men indicates increased risk for alcohol abuse, EXCEPT if all of the points are from question 1    Assessment & Plan    1. Encounter for general adult medical examination with abnormal findings Annual physical today. Will get routine, fasting labs and review results with patient at next visit in two weeks.   2. Other fatigue Check Free T4 and vitamin d with labs for further evaluation.    3. Abnormal weight gain Check labs prior to next visit in two weeks. Discuss medications to help with weight loss. Discussed lowering calorie intake to 1500 calories per day and incorporating exercise into daily routine to help lose weight.    4. Vitamin D deficiency Check vitamin d level with labs and treat deficiency as indicated.    Exercise Activities and Dietary recommendations  Goals   None     Immunization History  Administered Date(s) Administered   Hepatitis A, Adult 06/11/1999   Hepatitis B 09/10/1999   Influenza-Unspecified 08/10/2012   MMR 07/10/1984   Tdap 11/03/2013    Health Maintenance  Topic Date Due   COVID-19 Vaccine (1) Never done   HIV Screening  Never done   Hepatitis C Screening  Never done   INFLUENZA VACCINE  05/10/2022   PAP SMEAR-Modifier  05/11/2023   TETANUS/TDAP  11/04/2023   HPV VACCINES  Aged Out    Discussed health benefits of physical activity, and encouraged her to engage in regular exercise appropriate for her age and condition.  Problem List Items Addressed This Visit       Other   Other fatigue   Abnormal weight gain   Vitamin D deficiency   Other Visit Diagnoses     Encounter for general adult medical examination with abnormal findings    -  Primary        Return in about 2 weeks (around 03/14/2022), or needs FBW along with Free T4 and vitamin d due to fatigue and abnormal weight gain, for routine - weight management.        Ronnell Freshwater, NP  [facility]  [address] [phone] (phone) [phone] (fax)  [place]

## 2022-03-01 ENCOUNTER — Other Ambulatory Visit: Payer: Self-pay

## 2022-03-01 DIAGNOSIS — Z0000 Encounter for general adult medical examination without abnormal findings: Secondary | ICD-10-CM

## 2022-03-01 DIAGNOSIS — E559 Vitamin D deficiency, unspecified: Secondary | ICD-10-CM

## 2022-03-01 DIAGNOSIS — R5383 Other fatigue: Secondary | ICD-10-CM

## 2022-03-01 DIAGNOSIS — R635 Abnormal weight gain: Secondary | ICD-10-CM

## 2022-03-08 ENCOUNTER — Other Ambulatory Visit: Payer: BC Managed Care – PPO

## 2022-03-08 DIAGNOSIS — Z0000 Encounter for general adult medical examination without abnormal findings: Secondary | ICD-10-CM

## 2022-03-08 DIAGNOSIS — R635 Abnormal weight gain: Secondary | ICD-10-CM

## 2022-03-08 DIAGNOSIS — R5383 Other fatigue: Secondary | ICD-10-CM

## 2022-03-08 DIAGNOSIS — E559 Vitamin D deficiency, unspecified: Secondary | ICD-10-CM

## 2022-03-09 LAB — COMPREHENSIVE METABOLIC PANEL
ALT: 13 IU/L (ref 0–32)
AST: 19 IU/L (ref 0–40)
Albumin/Globulin Ratio: 1.8 (ref 1.2–2.2)
Albumin: 4 g/dL (ref 3.8–4.8)
Alkaline Phosphatase: 44 IU/L (ref 44–121)
BUN/Creatinine Ratio: 17 (ref 9–23)
BUN: 14 mg/dL (ref 6–20)
Bilirubin Total: 0.4 mg/dL (ref 0.0–1.2)
CO2: 22 mmol/L (ref 20–29)
Calcium: 9.2 mg/dL (ref 8.7–10.2)
Chloride: 104 mmol/L (ref 96–106)
Creatinine, Ser: 0.84 mg/dL (ref 0.57–1.00)
Globulin, Total: 2.2 g/dL (ref 1.5–4.5)
Glucose: 87 mg/dL (ref 70–99)
Potassium: 4.2 mmol/L (ref 3.5–5.2)
Sodium: 138 mmol/L (ref 134–144)
Total Protein: 6.2 g/dL (ref 6.0–8.5)
eGFR: 92 mL/min/{1.73_m2} (ref >59)

## 2022-03-09 LAB — CBC WITH DIFFERENTIAL/PLATELET
Basophils Absolute: 0.1 10*3/uL (ref 0.0–0.2)
Basos: 1 %
EOS (ABSOLUTE): 0.3 10*3/uL (ref 0.0–0.4)
Eos: 5 %
Hematocrit: 38.7 % (ref 34.0–46.6)
Hemoglobin: 13 g/dL (ref 11.1–15.9)
Immature Grans (Abs): 0 10*3/uL (ref 0.0–0.1)
Immature Granulocytes: 0 %
Lymphocytes Absolute: 1.5 10*3/uL (ref 0.7–3.1)
Lymphs: 28 %
MCH: 30.4 pg (ref 26.6–33.0)
MCHC: 33.6 g/dL (ref 31.5–35.7)
MCV: 90 fL (ref 79–97)
Monocytes Absolute: 0.4 10*3/uL (ref 0.1–0.9)
Monocytes: 8 %
Neutrophils Absolute: 3.2 10*3/uL (ref 1.4–7.0)
Neutrophils: 58 %
Platelets: 245 10*3/uL (ref 150–450)
RBC: 4.28 x10E6/uL (ref 3.77–5.28)
RDW: 12.1 % (ref 11.7–15.4)
WBC: 5.4 10*3/uL (ref 3.4–10.8)

## 2022-03-09 LAB — HEMOGLOBIN A1C
Est. average glucose Bld gHb Est-mCnc: 97 mg/dL
Hgb A1c MFr Bld: 5 % (ref 4.8–5.6)

## 2022-03-09 LAB — TSH: TSH: 1.43 u[IU]/mL (ref 0.450–4.500)

## 2022-03-09 LAB — LIPID PANEL
Chol/HDL Ratio: 3.1 ratio (ref 0.0–4.4)
Cholesterol, Total: 185 mg/dL (ref 100–199)
HDL: 60 mg/dL (ref >39)
LDL Chol Calc (NIH): 109 mg/dL — ABNORMAL HIGH (ref 0–99)
Triglycerides: 90 mg/dL (ref 0–149)
VLDL Cholesterol Cal: 16 mg/dL (ref 5–40)

## 2022-03-09 LAB — VITAMIN D 25 HYDROXY (VIT D DEFICIENCY, FRACTURES): Vit D, 25-Hydroxy: 35.6 ng/mL (ref 30.0–100.0)

## 2022-03-09 LAB — T4, FREE: Free T4: 1.35 ng/dL (ref 0.82–1.77)

## 2022-03-09 NOTE — Progress Notes (Signed)
Labs look good in general. Discuss with patient during visit 03/14/2022

## 2022-03-14 ENCOUNTER — Encounter: Payer: Self-pay | Admitting: Nurse Practitioner

## 2022-03-14 ENCOUNTER — Ambulatory Visit (INDEPENDENT_AMBULATORY_CARE_PROVIDER_SITE_OTHER): Payer: BC Managed Care – PPO | Admitting: Nurse Practitioner

## 2022-03-14 VITALS — BP 103/65 | HR 72 | Temp 97.9°F | Ht 66.93 in | Wt 260.4 lb

## 2022-03-14 DIAGNOSIS — E785 Hyperlipidemia, unspecified: Secondary | ICD-10-CM

## 2022-03-14 DIAGNOSIS — Z6841 Body Mass Index (BMI) 40.0 and over, adult: Secondary | ICD-10-CM | POA: Diagnosis not present

## 2022-03-14 DIAGNOSIS — R5383 Other fatigue: Secondary | ICD-10-CM | POA: Diagnosis not present

## 2022-03-14 MED ORDER — PHENTERMINE HCL 37.5 MG PO TABS: 37.5000 mg | ORAL_TABLET | Freq: Every day | ORAL | 0 refills | Status: DC

## 2022-03-14 NOTE — Progress Notes (Signed)
Established patient visit   Patient: Alexandra May   DOB: 19-Apr-1986   36 y.o. Female  MRN: [id number] Visit Date: 03/14/2022   Chief Complaint  Patient presents with   Follow-up   Subjective    HPI  Patient presenting to discuss medications to help with weight management.  -routine fasting labs done prior to this visit.  --mild elevation of LDL --all other labs normal.    Medications: No outpatient medications prior to visit.   No facility-administered medications prior to visit.    Review of Systems  Constitutional:  Positive for fatigue. Negative for activity change, appetite change, chills and fever.       Two pound weight gain since most recent visit   HENT:  Negative for congestion, postnasal drip, rhinorrhea, sinus pressure, sinus pain, sneezing and sore throat.   Eyes: Negative.   Respiratory:  Negative for cough, chest tightness, shortness of breath and wheezing.   Cardiovascular:  Negative for chest pain and palpitations.  Gastrointestinal:  Negative for abdominal pain, constipation, diarrhea, nausea and vomiting.  Endocrine: Negative for cold intolerance, heat intolerance, polydipsia and polyuria.  Genitourinary:  Negative for dyspareunia, dysuria, flank pain, frequency and urgency.  Musculoskeletal:  Negative for arthralgias, back pain and myalgias.  Skin:  Negative for rash.  Allergic/Immunologic: Negative for environmental allergies.  Neurological:  Negative for dizziness, weakness and headaches.  Hematological:  Negative for adenopathy.  Psychiatric/Behavioral:  The patient is not nervous/anxious.     Last CBC Lab Results  Component Value Date   WBC 5.4 03/08/2022   HGB 13.0 03/08/2022   HCT 38.7 03/08/2022   MCV 90 03/08/2022   MCH 30.4 03/08/2022   RDW 12.1 03/08/2022   PLT 245 46/80/3212   Last metabolic panel Lab Results  Component Value Date   GLUCOSE 87 03/08/2022   NA 138 03/08/2022   K 4.2 03/08/2022   CL 104 03/08/2022   CO2 22  03/08/2022   BUN 14 03/08/2022   CREATININE 0.84 03/08/2022   EGFR 92 03/08/2022   CALCIUM 9.2 03/08/2022   PROT 6.2 03/08/2022   ALBUMIN 4.0 03/08/2022   LABGLOB 2.2 03/08/2022   AGRATIO 1.8 03/08/2022   BILITOT 0.4 03/08/2022   ALKPHOS 44 03/08/2022   AST 19 03/08/2022   ALT 13 03/08/2022   Last lipids Lab Results  Component Value Date   CHOL 185 03/08/2022   HDL 60 03/08/2022   LDLCALC 109 (H) 03/08/2022   TRIG 90 03/08/2022   CHOLHDL 3.1 03/08/2022   Last hemoglobin A1c Lab Results  Component Value Date   HGBA1C 5.0 03/08/2022   Last thyroid functions Lab Results  Component Value Date   TSH 1.430 03/08/2022   T3TOTAL 105 01/15/2021   Last vitamin D Lab Results  Component Value Date   VD25OH 35.6 03/08/2022       Objective     Today's Vitals   03/14/22 1603  BP: 103/65  Pulse: 72  Temp: 97.9 F (36.6 C)  SpO2: 100%  Weight: 260 lb 6.4 oz (118.1 kg)  Height: 5' 6.93" (1.7 m)   Body mass index is 40.87 kg/m.   BP Readings from Last 3 Encounters:  03/14/22 103/65  02/28/22 118/75  08/19/21 117/71    Wt Readings from Last 3 Encounters:  03/14/22 260 lb 6.4 oz (118.1 kg)  02/28/22 258 lb 12.8 oz (117.4 kg)  08/19/21 260 lb (117.9 kg)    Physical Exam Vitals and nursing note reviewed.  Constitutional:  Appearance: Normal appearance. She is well-developed.  HENT:     Head: Normocephalic and atraumatic.  Eyes:     Pupils: Pupils are equal, round, and reactive to light.  Cardiovascular:     Rate and Rhythm: Normal rate and regular rhythm.     Pulses: Normal pulses.     Heart sounds: Normal heart sounds.  Pulmonary:     Effort: Pulmonary effort is normal.     Breath sounds: Normal breath sounds.  Abdominal:     Palpations: Abdomen is soft.  Musculoskeletal:        General: Normal range of motion.     Cervical back: Normal range of motion and neck supple.  Lymphadenopathy:     Cervical: No cervical adenopathy.  Skin:    General:  Skin is warm and dry.     Capillary Refill: Capillary refill takes less than 2 seconds.  Neurological:     General: No focal deficit present.     Mental Status: She is alert and oriented to person, place, and time.  Psychiatric:        Mood and Affect: Mood normal.        Behavior: Behavior normal.        Thought Content: Thought content normal.        Judgment: Judgment normal.       Assessment & Plan     1. Hyperlipidemia LDL goal <100 Reviewed labs with mildly elevated LDL. Recommend she limit intake of fried and fatty foods. She should increase intake of lean proteins and green leafy vegetables. Adding exercise into daily routine will also be beneficial.    2. BMI 40.0-44.9, adult (HCC) Add phentermine 37.62m tablets daily. Discussed lowering calorie intake to 1500 calories per day and incorporating exercise into daily routine to help lose weight.  - phentermine (ADIPEX-P) 37.5 MG tablet; Take 1 tablet (37.5 mg total) by mouth daily before breakfast.  Dispense: 30 tablet; Refill: 0   Problem List Items Addressed This Visit       Other   Other fatigue - Primary   BMI 40.0-44.9, adult (HCC)   Relevant Medications   phentermine (ADIPEX-P) 37.5 MG tablet   Hyperlipidemia LDL goal <100     Return in about 4 weeks (around 04/11/2022) for routine - weight management.         HRonnell Freshwater NP  [phone](phone) [phone](fax)  [place]

## 2022-03-20 DIAGNOSIS — Z6841 Body Mass Index (BMI) 40.0 and over, adult: Secondary | ICD-10-CM | POA: Insufficient documentation

## 2022-03-20 DIAGNOSIS — E785 Hyperlipidemia, unspecified: Secondary | ICD-10-CM | POA: Insufficient documentation

## 2022-04-11 ENCOUNTER — Ambulatory Visit (INDEPENDENT_AMBULATORY_CARE_PROVIDER_SITE_OTHER): Payer: BC Managed Care – PPO | Admitting: Nurse Practitioner

## 2022-04-11 ENCOUNTER — Encounter: Payer: Self-pay | Admitting: Nurse Practitioner

## 2022-04-11 VITALS — BP 99/65 | HR 70 | Ht 66.93 in | Wt 252.1 lb

## 2022-04-11 DIAGNOSIS — Z6841 Body Mass Index (BMI) 40.0 and over, adult: Secondary | ICD-10-CM

## 2022-04-11 DIAGNOSIS — I10 Essential (primary) hypertension: Secondary | ICD-10-CM

## 2022-04-11 MED ORDER — PHENTERMINE HCL 37.5 MG PO TABS: 37.5000 mg | ORAL_TABLET | Freq: Every day | ORAL | 0 refills | Status: DC

## 2022-04-11 NOTE — Progress Notes (Signed)
Established patient visit   Patient: Alexandra May   DOB: 1986/05/24   36 y.o. Female  MRN: [id number] Visit Date: 04/11/2022   Chief Complaint  Patient presents with   Weight Check   Subjective    HPI  Started phentermine at most recent visit.  -initial weight 03/14/2022 - 260 -today's weight 04/11/2022 - 252 -weight loss since most recent visit - 8 pounds  -total weight loss -  8 pounds  -side effects from taking phentermine - no negative side effects   Medications: Outpatient Medications Prior to Visit  Medication Sig   [DISCONTINUED] phentermine (ADIPEX-P) 37.5 MG tablet Take 1 tablet (37.5 mg total) by mouth daily before breakfast.   azithromycin (ZITHROMAX) 250 MG tablet    celecoxib (CELEBREX) 100 MG capsule    labetalol (NORMODYNE) 100 MG tablet    metroNIDAZOLE (FLAGYL) 500 MG tablet Take 1 tablet every 12 hours by oral route for 7 days.   No facility-administered medications prior to visit.    Review of Systems  Constitutional:  Negative for activity change, appetite change, chills, fatigue and fever.       8 pound weight loss since most recent visit.  HENT:  Negative for congestion, postnasal drip, rhinorrhea, sinus pressure, sinus pain, sneezing and sore throat.   Eyes: Negative.   Respiratory:  Negative for cough, chest tightness, shortness of breath and wheezing.   Cardiovascular:  Negative for chest pain and palpitations.  Gastrointestinal:  Negative for abdominal pain, constipation, diarrhea, nausea and vomiting.  Endocrine: Negative for cold intolerance, heat intolerance, polydipsia and polyuria.  Genitourinary:  Negative for dyspareunia, dysuria, flank pain, frequency and urgency.  Musculoskeletal:  Negative for arthralgias, back pain and myalgias.  Skin:  Negative for rash.  Allergic/Immunologic: Negative for environmental allergies.  Neurological:  Negative for dizziness, weakness and headaches.  Hematological:  Negative for adenopathy.   Psychiatric/Behavioral:  The patient is not nervous/anxious.        Objective     Today's Vitals   04/11/22 1609  BP: 99/65  Pulse: 70  SpO2: 99%  Weight: 252 lb 1.9 oz (114.4 kg)  Height: 5' 6.93" (1.7 m)   Body mass index is 39.57 kg/m.   BP Readings from Last 3 Encounters:  04/11/22 99/65  03/14/22 103/65  02/28/22 118/75    Wt Readings from Last 3 Encounters:  04/11/22 252 lb 1.9 oz (114.4 kg)  03/14/22 260 lb 6.4 oz (118.1 kg)  02/28/22 258 lb 12.8 oz (117.4 kg)    Physical Exam Vitals and nursing note reviewed.  Constitutional:      Appearance: Normal appearance. She is well-developed.  HENT:     Head: Normocephalic and atraumatic.     Nose: Nose normal.     Mouth/Throat:     Mouth: Mucous membranes are moist.     Pharynx: Oropharynx is clear.  Eyes:     Extraocular Movements: Extraocular movements intact.     Conjunctiva/sclera: Conjunctivae normal.     Pupils: Pupils are equal, round, and reactive to light.  Cardiovascular:     Rate and Rhythm: Normal rate and regular rhythm.     Pulses: Normal pulses.     Heart sounds: Normal heart sounds.  Pulmonary:     Effort: Pulmonary effort is normal.     Breath sounds: Normal breath sounds.  Abdominal:     Palpations: Abdomen is soft.  Musculoskeletal:        General: Normal range of motion.     Cervical  back: Normal range of motion and neck supple.  Lymphadenopathy:     Cervical: No cervical adenopathy.  Skin:    General: Skin is warm and dry.     Capillary Refill: Capillary refill takes less than 2 seconds.  Neurological:     General: No focal deficit present.     Mental Status: She is alert and oriented to person, place, and time.  Psychiatric:        Mood and Affect: Mood normal.        Behavior: Behavior normal.        Thought Content: Thought content normal.        Judgment: Judgment normal.      Assessment & Plan    1. Essential hypertension Stable.  Continue blood pressure medication  as prescribed.  2. BMI 40.0-44.9, adult (HCC) Improved with weight loss since most recent visit.  Continue phentermine 37.5 mg tablets daily. Discussed lowering calorie intake to 1500 calories per day and incorporating exercise into daily routine to help lose weight. Reassess I none month  - phentermine (ADIPEX-P) 37.5 MG tablet; Take 1 tablet (37.5 mg total) by mouth daily before breakfast.  Dispense: 30 tablet; Refill: 0   Problem List Items Addressed This Visit       Cardiovascular and Mediastinum   Essential hypertension - Primary   Relevant Medications   labetalol (NORMODYNE) 100 MG tablet     Other   BMI 40.0-44.9, adult (HCC)   Relevant Medications   phentermine (ADIPEX-P) 37.5 MG tablet     Return in about 4 weeks (around 05/09/2022) for routine - weight management.         Carlean Jews, NP  [facility] [phone] (phone) [phone] (fax)  [facility]

## 2022-04-24 DIAGNOSIS — I10 Essential (primary) hypertension: Secondary | ICD-10-CM | POA: Insufficient documentation

## 2022-05-17 ENCOUNTER — Encounter: Payer: Self-pay | Admitting: Nurse Practitioner

## 2022-05-17 ENCOUNTER — Ambulatory Visit (INDEPENDENT_AMBULATORY_CARE_PROVIDER_SITE_OTHER): Payer: BC Managed Care – PPO | Admitting: Nurse Practitioner

## 2022-05-17 VITALS — BP 107/73 | HR 83 | Ht 66.93 in | Wt 255.4 lb

## 2022-05-17 DIAGNOSIS — R5383 Other fatigue: Secondary | ICD-10-CM

## 2022-05-17 DIAGNOSIS — Z6841 Body Mass Index (BMI) 40.0 and over, adult: Secondary | ICD-10-CM | POA: Diagnosis not present

## 2022-05-17 MED ORDER — PHENTERMINE HCL 37.5 MG PO TABS: 37.5000 mg | ORAL_TABLET | Freq: Every day | ORAL | 0 refills | Status: DC

## 2022-05-17 NOTE — Progress Notes (Signed)
Established patient visit   Patient: Alexandra May   DOB: 04-24-1986   36 y.o. Female  MRN: [id number] Visit Date: 05/17/2022   Chief Complaint  Patient presents with   Weight Check   Subjective    HPI  Started phentermine at most recent visit.  -initial weight 03/14/2022 - 260 -most recent weight 04/11/2022 - 252 -today's weight 05/17/2022 - 255 pounds  -weight loss since most recent visit -  3 pound weight gain  -total weight loss -  5 pounds -side effects from taking phentermine - no negative side effects  -states that she feels medication is effective and would like to try for additional 30 days.   Medications: Outpatient Medications Prior to Visit  Medication Sig   azithromycin (ZITHROMAX) 250 MG tablet    celecoxib (CELEBREX) 100 MG capsule    labetalol (NORMODYNE) 100 MG tablet    metroNIDAZOLE (FLAGYL) 500 MG tablet Take 1 tablet every 12 hours by oral route for 7 days.   [DISCONTINUED] phentermine (ADIPEX-P) 37.5 MG tablet Take 1 tablet (37.5 mg total) by mouth daily before breakfast.   No facility-administered medications prior to visit.    Review of Systems  Constitutional:  Negative for activity change, appetite change, chills, fatigue and fever.       Three pound weight gain over last month   HENT:  Negative for congestion, postnasal drip, rhinorrhea, sinus pressure, sinus pain, sneezing and sore throat.   Eyes: Negative.   Respiratory:  Negative for cough, chest tightness, shortness of breath and wheezing.   Cardiovascular:  Negative for chest pain and palpitations.  Gastrointestinal:  Negative for abdominal pain, constipation, diarrhea, nausea and vomiting.  Endocrine: Negative for cold intolerance, heat intolerance, polydipsia and polyuria.  Genitourinary:  Negative for dyspareunia, dysuria, flank pain, frequency and urgency.  Musculoskeletal:  Negative for arthralgias, back pain and myalgias.  Skin:  Negative for rash.  Allergic/Immunologic: Negative for  environmental allergies.  Neurological:  Negative for dizziness, weakness and headaches.  Hematological:  Negative for adenopathy.  Psychiatric/Behavioral:  The patient is not nervous/anxious.        Objective     Today's Vitals   05/17/22 1609  BP: 107/73  Pulse: 83  SpO2: 97%  Weight: 255 lb 6.4 oz (115.8 kg)  Height: 5' 6.93" (1.7 m)   Body mass index is 40.09 kg/m.   BP Readings from Last 3 Encounters:  05/17/22 107/73  04/11/22 99/65  03/14/22 103/65    Wt Readings from Last 3 Encounters:  05/17/22 255 lb 6.4 oz (115.8 kg)  04/11/22 252 lb 1.9 oz (114.4 kg)  03/14/22 260 lb 6.4 oz (118.1 kg)    Physical Exam Vitals and nursing note reviewed.  Constitutional:      Appearance: Normal appearance. She is well-developed. She is obese.  HENT:     Head: Normocephalic and atraumatic.     Nose: Nose normal.     Mouth/Throat:     Mouth: Mucous membranes are moist.     Pharynx: Oropharynx is clear.  Eyes:     Extraocular Movements: Extraocular movements intact.     Conjunctiva/sclera: Conjunctivae normal.     Pupils: Pupils are equal, round, and reactive to light.  Cardiovascular:     Rate and Rhythm: Normal rate and regular rhythm.     Pulses: Normal pulses.     Heart sounds: Normal heart sounds.  Pulmonary:     Effort: Pulmonary effort is normal.     Breath sounds: Normal breath  sounds.  Abdominal:     Palpations: Abdomen is soft.  Musculoskeletal:        General: Normal range of motion.     Cervical back: Normal range of motion and neck supple.  Lymphadenopathy:     Cervical: No cervical adenopathy.  Skin:    General: Skin is warm and dry.     Capillary Refill: Capillary refill takes less than 2 seconds.  Neurological:     General: No focal deficit present.     Mental Status: She is alert and oriented to person, place, and time.  Psychiatric:        Mood and Affect: Mood normal.        Behavior: Behavior normal.        Thought Content: Thought content  normal.        Judgment: Judgment normal.       Assessment & Plan    1. Other fatigue Improved. Reassess in one month  2. BMI 40.0-44.9, adult (HCC) Will continue phentermine daily. Discussed lowering calorie intake to 1500 calories per day and incorporating exercise into daily routine to help lose weight. Her goal is to  start walking regimen three to four days per week for the next 30 days.  - phentermine (ADIPEX-P) 37.5 MG tablet; Take 1 tablet (37.5 mg total) by mouth daily before breakfast.  Dispense: 30 tablet; Refill: 0   Problem List Items Addressed This Visit       Other   Other fatigue - Primary   BMI 40.0-44.9, adult (HCC)   Relevant Medications   phentermine (ADIPEX-P) 37.5 MG tablet     Return in about 4 weeks (around 06/14/2022) for routine - weight management.         Carlean Jews, NP  [facility] at [facility] [phone] (phone) [phone] (fax)  [facility]

## 2022-06-13 NOTE — Progress Notes (Signed)
Established patient visit   Patient: Alexandra May   DOB: [birth year]   36 y.o. Female  MRN: [id number] Visit Date: 06/14/2022   Chief Complaint  Patient presents with   Weight Check   Subjective    HPI  Started phentermine at most recent visit.  -initial weight 03/14/2022 - 260 -most recent weight 05/17/2022  - 255 -today's weight 06/14/2022  250 -weight loss since most recent visit -  5 pounds  -total weight loss -  10 pounds  -side effects from taking phentermine - no negative side effects  -states that she feels medication is effective and would like to try for additional 30 days.    Medications: Outpatient Medications Prior to Visit  Medication Sig   azithromycin (ZITHROMAX) 250 MG tablet    celecoxib (CELEBREX) 100 MG capsule    labetalol (NORMODYNE) 100 MG tablet    metroNIDAZOLE (FLAGYL) 500 MG tablet Take 1 tablet every 12 hours by oral route for 7 days.   [DISCONTINUED] phentermine (ADIPEX-P) 37.5 MG tablet Take 1 tablet (37.5 mg total) by mouth daily before breakfast.   No facility-administered medications prior to visit.    Review of Systems  Constitutional:  Negative for activity change, appetite change, chills, fatigue and fever.       Five pound weight loss since last visit   HENT:  Negative for congestion, postnasal drip, rhinorrhea, sinus pressure, sinus pain, sneezing and sore throat.   Eyes: Negative.   Respiratory:  Negative for cough, chest tightness, shortness of breath and wheezing.   Cardiovascular:  Negative for chest pain and palpitations.  Gastrointestinal:  Negative for abdominal pain, constipation, diarrhea, nausea and vomiting.  Endocrine: Negative for cold intolerance, heat intolerance, polydipsia and polyuria.  Genitourinary:  Negative for dyspareunia, dysuria, flank pain, frequency and urgency.  Musculoskeletal:  Negative for arthralgias, back pain and myalgias.  Skin:  Negative for rash.  Allergic/Immunologic: Negative for environmental  allergies.  Neurological:  Negative for dizziness, weakness and headaches.  Hematological:  Negative for adenopathy.  Psychiatric/Behavioral:  The patient is not nervous/anxious.        Objective     Today's Vitals   06/14/22 1620  BP: 107/73  Pulse: 72  SpO2: 99%  Weight: 250 lb 12.8 oz (113.8 kg)  Height: 5' 6.93" (1.7 m)   Body mass index is 39.36 kg/m.   BP Readings from Last 3 Encounters:  06/14/22 107/73  05/17/22 107/73  04/11/22 99/65    Wt Readings from Last 3 Encounters:  06/14/22 250 lb 12.8 oz (113.8 kg)  05/17/22 255 lb 6.4 oz (115.8 kg)  04/11/22 252 lb 1.9 oz (114.4 kg)    Physical Exam Vitals and nursing note reviewed.  Constitutional:      Appearance: Normal appearance. She is well-developed. She is obese.  HENT:     Head: Normocephalic and atraumatic.     Nose: Nose normal.     Mouth/Throat:     Mouth: Mucous membranes are moist.     Pharynx: Oropharynx is clear.  Eyes:     Extraocular Movements: Extraocular movements intact.     Conjunctiva/sclera: Conjunctivae normal.     Pupils: Pupils are equal, round, and reactive to light.  Cardiovascular:     Rate and Rhythm: Normal rate and regular rhythm.     Pulses: Normal pulses.     Heart sounds: Normal heart sounds.  Pulmonary:     Effort: Pulmonary effort is normal.     Breath sounds: Normal breath sounds.  Abdominal:     Palpations: Abdomen is soft.  Musculoskeletal:        General: Normal range of motion.     Cervical back: Normal range of motion and neck supple.  Lymphadenopathy:     Cervical: No cervical adenopathy.  Skin:    General: Skin is warm and dry.     Capillary Refill: Capillary refill takes less than 2 seconds.  Neurological:     General: No focal deficit present.     Mental Status: She is alert and oriented to person, place, and time.  Psychiatric:        Mood and Affect: Mood normal.        Behavior: Behavior normal.        Thought Content: Thought content normal.         Judgment: Judgment normal.      Assessment & Plan    1. Other fatigue Improving with recent weight loss. Will continue to monitor monthly   2. BMI 39.0-39.9,adult Improving. May continue phentermine daily. Continue lower calorie intake to 1500 calories per day and incorporating exercise into daily routine to help lose weight. Reassess in one month.  - phentermine (ADIPEX-P) 37.5 MG tablet; Take 1 tablet (37.5 mg total) by mouth daily before breakfast.  Dispense: 30 tablet; Refill: 0   Problem List Items Addressed This Visit       Other   Other fatigue - Primary   BMI 40.0-44.9, adult (HCC)   Relevant Medications   phentermine (ADIPEX-P) 37.5 MG tablet     Return in about 4 weeks (around 07/12/2022) for routine - weight management.         Carlean Jews, NP  [facility] at [facility] [phone] (phone) [phone] (fax)  [facility]

## 2022-06-14 ENCOUNTER — Ambulatory Visit (INDEPENDENT_AMBULATORY_CARE_PROVIDER_SITE_OTHER): Payer: BC Managed Care – PPO | Admitting: Nurse Practitioner

## 2022-06-14 ENCOUNTER — Encounter: Payer: Self-pay | Admitting: Nurse Practitioner

## 2022-06-14 VITALS — BP 107/73 | HR 72 | Ht 66.93 in | Wt 250.8 lb

## 2022-06-14 DIAGNOSIS — Z6839 Body mass index (BMI) 39.0-39.9, adult: Secondary | ICD-10-CM | POA: Diagnosis not present

## 2022-06-14 DIAGNOSIS — R5383 Other fatigue: Secondary | ICD-10-CM | POA: Diagnosis not present

## 2022-06-14 MED ORDER — PHENTERMINE HCL 37.5 MG PO TABS: 37.5000 mg | ORAL_TABLET | Freq: Every day | ORAL | 0 refills | Status: DC

## 2022-07-18 ENCOUNTER — Telehealth: Payer: Self-pay

## 2022-07-18 NOTE — Telephone Encounter (Signed)
Pt think that she has a sinus infection and was requesting the Z-pack.  Pt sxs congestion OTC is not helping, Pain in upper cheek area, headache, denies fever. Cold like symptoms started about 2 weeks ago and the pain in face started about 3 days ago.  Pt scheduled a follow up appt tomorrow 07/19/22 for weight management  Please advise  [phone]

## 2022-07-18 NOTE — Telephone Encounter (Signed)
Great. Thank you.

## 2022-07-18 NOTE — Telephone Encounter (Signed)
Can she give you a little more information. I know I just saw her last month, but the z-pack was for almost a year ago.

## 2022-07-18 NOTE — Telephone Encounter (Signed)
Patient called office to see if she could a get a refill on her z-pack, patient please advise, thanks!

## 2022-07-18 NOTE — Telephone Encounter (Signed)
Called pt LVM to contact the office °

## 2022-07-19 ENCOUNTER — Encounter: Payer: Self-pay | Admitting: Nurse Practitioner

## 2022-07-19 ENCOUNTER — Ambulatory Visit (INDEPENDENT_AMBULATORY_CARE_PROVIDER_SITE_OTHER): Payer: BC Managed Care – PPO | Admitting: Nurse Practitioner

## 2022-07-19 VITALS — BP 110/76 | HR 80 | Ht 66.93 in | Wt 244.4 lb

## 2022-07-19 DIAGNOSIS — I10 Essential (primary) hypertension: Secondary | ICD-10-CM | POA: Diagnosis not present

## 2022-07-19 DIAGNOSIS — Z6838 Body mass index (BMI) 38.0-38.9, adult: Secondary | ICD-10-CM

## 2022-07-19 DIAGNOSIS — J0140 Acute pansinusitis, unspecified: Secondary | ICD-10-CM

## 2022-07-19 DIAGNOSIS — Z6839 Body mass index (BMI) 39.0-39.9, adult: Secondary | ICD-10-CM

## 2022-07-19 MED ORDER — AZITHROMYCIN 250 MG PO TABS: ORAL_TABLET | ORAL | 0 refills | Status: DC

## 2022-07-19 MED ORDER — PHENTERMINE HCL 37.5 MG PO TABS: 37.5000 mg | ORAL_TABLET | Freq: Every day | ORAL | 0 refills | Status: DC

## 2022-07-19 NOTE — Progress Notes (Signed)
Established patient visit   Patient: Alexandra May   DOB: [birth year]   36 y.o. Female  MRN: [id number] Visit Date: 07/19/2022   Chief Complaint  Patient presents with   Weight Check   Subjective    Sinusitis This is a new problem. The current episode started 1 to 4 weeks ago. The problem has been gradually worsening since onset. There has been no fever. Associated symptoms include congestion, coughing, headaches, a hoarse voice, sinus pressure and a sore throat. Past treatments include oral decongestants and acetaminophen. The treatment provided no relief.    Started phentermine 03/14/2022 -initial weight 03/14/2022 - 260 -most recent weight 06/14/2022 - 250 -today's weight 07/19/2022 - 244 pounds  -weight loss since most recent visit -6 pounds  -total weight loss -  16 pounds  -side effects from taking phentermine - no negative side effects  -states that she feels medication is effective and would like to try for additional 30 days.    Medications: Outpatient Medications Prior to Visit  Medication Sig   celecoxib (CELEBREX) 100 MG capsule    labetalol (NORMODYNE) 100 MG tablet    metroNIDAZOLE (FLAGYL) 500 MG tablet Take 1 tablet every 12 hours by oral route for 7 days.   [DISCONTINUED] azithromycin (ZITHROMAX) 250 MG tablet    [DISCONTINUED] phentermine (ADIPEX-P) 37.5 MG tablet Take 1 tablet (37.5 mg total) by mouth daily before breakfast.   No facility-administered medications prior to visit.    Review of Systems  Constitutional:  Positive for fatigue.       Six pound weight loss since most recent visit.   HENT:  Positive for congestion, hoarse voice, postnasal drip, rhinorrhea, sinus pressure and sore throat.   Respiratory:  Positive for cough.   Allergic/Immunologic: Positive for environmental allergies.  Neurological:  Positive for headaches.  Hematological:  Positive for adenopathy.  All other systems reviewed and are negative.      Objective     Today's Vitals    07/19/22 1619  BP: 110/76  Pulse: 80  SpO2: 100%  Weight: 244 lb 6.4 oz (110.9 kg)  Height: 5' 6.93" (1.7 m)   Body mass index is 38.36 kg/m.  BP Readings from Last 3 Encounters:  07/19/22 110/76  06/14/22 107/73  05/17/22 107/73    Wt Readings from Last 3 Encounters:  07/19/22 244 lb 6.4 oz (110.9 kg)  06/14/22 250 lb 12.8 oz (113.8 kg)  05/17/22 255 lb 6.4 oz (115.8 kg)    Physical Exam Vitals and nursing note reviewed.  Constitutional:      Appearance: Normal appearance. She is well-developed.  HENT:     Head: Normocephalic and atraumatic.     Right Ear: Tympanic membrane is erythematous and bulging.     Left Ear: Tympanic membrane is erythematous and bulging.     Nose: Congestion present.     Right Sinus: Frontal sinus tenderness present.     Left Sinus: Frontal sinus tenderness present.  Eyes:     Pupils: Pupils are equal, round, and reactive to light.  Cardiovascular:     Rate and Rhythm: Normal rate and regular rhythm.     Pulses: Normal pulses.     Heart sounds: Normal heart sounds.  Pulmonary:     Effort: Pulmonary effort is normal.     Breath sounds: Normal breath sounds.  Abdominal:     Palpations: Abdomen is soft.  Musculoskeletal:        General: Normal range of motion.     Cervical  back: Normal range of motion and neck supple.  Lymphadenopathy:     Cervical: Cervical adenopathy present.  Skin:    General: Skin is warm and dry.     Capillary Refill: Capillary refill takes less than 2 seconds.  Neurological:     General: No focal deficit present.     Mental Status: She is alert and oriented to person, place, and time.  Psychiatric:        Mood and Affect: Mood normal.        Behavior: Behavior normal.        Thought Content: Thought content normal.        Judgment: Judgment normal.      Assessment & Plan    1. Acute non-recurrent pansinusitis Start z-pack. Take as directed for  days. Rest and increase fluids. Continue using OTC medication to  control symptoms.   - azithromycin (ZITHROMAX) 250 MG tablet; z-pack - take as directed for 5 days  Dispense: 6 tablet; Refill: 0  2. BMI 38.0-38.9,adult Improving. Continue phentermine daily. Recommend she continue with low calorie diet and increased exercise. Recheck in  month.  - phentermine (ADIPEX-P) 37.5 MG tablet; Take 1 tablet (37.5 mg total) by mouth daily before breakfast.  Dispense: 30 tablet; Refill: 0  3. Essential hypertension Stable. Continue BP medication as prescribed     Problem List Items Addressed This Visit       Cardiovascular and Mediastinum   Essential hypertension     Respiratory   Acute non-recurrent pansinusitis - Primary   Relevant Medications   azithromycin (ZITHROMAX) 250 MG tablet   Other Visit Diagnoses     BMI 38.0-38.9,adult       Relevant Medications   phentermine (ADIPEX-P) 37.5 MG tablet        Return in about 4 weeks (around 08/16/2022) for routine - weight management.         Ronnell Freshwater, NP  [facility] at [facility] [phone] (phone) [phone] (fax)  [place]

## 2022-08-08 DIAGNOSIS — J0140 Acute pansinusitis, unspecified: Secondary | ICD-10-CM | POA: Insufficient documentation

## 2022-08-22 ENCOUNTER — Ambulatory Visit: Payer: BC Managed Care – PPO | Admitting: Nurse Practitioner

## 2022-09-07 ENCOUNTER — Encounter: Payer: Self-pay | Admitting: Nurse Practitioner

## 2022-09-07 ENCOUNTER — Ambulatory Visit (INDEPENDENT_AMBULATORY_CARE_PROVIDER_SITE_OTHER): Payer: BC Managed Care – PPO | Admitting: Nurse Practitioner

## 2022-09-07 VITALS — BP 139/86 | HR 75 | Resp 18 | Ht 66.93 in | Wt 243.0 lb

## 2022-09-07 DIAGNOSIS — Z6838 Body mass index (BMI) 38.0-38.9, adult: Secondary | ICD-10-CM

## 2022-09-07 DIAGNOSIS — E785 Hyperlipidemia, unspecified: Secondary | ICD-10-CM

## 2022-09-07 DIAGNOSIS — Z23 Encounter for immunization: Secondary | ICD-10-CM | POA: Diagnosis not present

## 2022-09-07 MED ORDER — PHENTERMINE HCL 37.5 MG PO TABS: 37.5000 mg | ORAL_TABLET | Freq: Every day | ORAL | 1 refills | Status: DC

## 2022-09-07 NOTE — Progress Notes (Signed)
Established patient visit   Patient: Alexandra May   DOB: 03/12/1986   36 y.o. Female  MRN: [id number] Visit Date: 09/07/2022   Chief Complaint  Patient presents with   Weight Check   Subjective    HPI  Started phentermine 03/14/2022 -initial weight 03/14/2022 - 260 -most recent weight 07/19/2022 - 244 -today's weight 09/07/2022 - 243 -weight loss since most recent visit - 1 pound  -total weight loss -  17 pounds  -side effects from taking phentermine - no negative side effects  -states that she feels medication is effective and would like to try for additional 30 days.    Medications: Outpatient Medications Prior to Visit  Medication Sig   [DISCONTINUED] phentermine (ADIPEX-P) 37.5 MG tablet Take 1 tablet (37.5 mg total) by mouth daily before breakfast.   azithromycin (ZITHROMAX) 250 MG tablet z-pack - take as directed for 5 days (Patient not taking: Reported on 09/07/2022)   celecoxib (CELEBREX) 100 MG capsule  (Patient not taking: Reported on 09/07/2022)   labetalol (NORMODYNE) 100 MG tablet  (Patient not taking: Reported on 09/07/2022)   metroNIDAZOLE (FLAGYL) 500 MG tablet Take 1 tablet every 12 hours by oral route for 7 days. (Patient not taking: Reported on 09/07/2022)   No facility-administered medications prior to visit.    Review of Systems  Constitutional:  Negative for activity change, appetite change, chills, fatigue and fever.       One pound weight loss since last visit   HENT:  Negative for congestion, postnasal drip, rhinorrhea, sinus pressure, sinus pain, sneezing and sore throat.   Eyes: Negative.   Respiratory:  Negative for cough, chest tightness, shortness of breath and wheezing.   Cardiovascular:  Negative for chest pain and palpitations.  Gastrointestinal:  Negative for abdominal pain, constipation, diarrhea, nausea and vomiting.  Endocrine: Negative for cold intolerance, heat intolerance, polydipsia and polyuria.  Genitourinary:  Negative for  dyspareunia, dysuria, flank pain, frequency and urgency.  Musculoskeletal:  Negative for arthralgias, back pain and myalgias.  Skin:  Negative for rash.  Allergic/Immunologic: Negative for environmental allergies.  Neurological:  Negative for dizziness, weakness and headaches.  Hematological:  Negative for adenopathy.  Psychiatric/Behavioral:  The patient is not nervous/anxious.        Objective     Today's Vitals   09/07/22 1616  BP: 139/86  Pulse: 75  Resp: 18  SpO2: 95%  Weight: 243 lb (110.2 kg)  Height: 5' 6.93" (1.7 m)   Body mass index is 38.14 kg/m.  BP Readings from Last 3 Encounters:  09/07/22 139/86  07/19/22 110/76  06/14/22 107/73    Wt Readings from Last 3 Encounters:  09/07/22 243 lb (110.2 kg)  07/19/22 244 lb 6.4 oz (110.9 kg)  06/14/22 250 lb 12.8 oz (113.8 kg)    Physical Exam Vitals and nursing note reviewed.  Constitutional:      Appearance: Normal appearance. She is well-developed.  HENT:     Head: Normocephalic and atraumatic.     Nose: Nose normal.     Mouth/Throat:     Mouth: Mucous membranes are moist.     Pharynx: Oropharynx is clear.  Eyes:     Extraocular Movements: Extraocular movements intact.     Conjunctiva/sclera: Conjunctivae normal.     Pupils: Pupils are equal, round, and reactive to light.  Cardiovascular:     Rate and Rhythm: Normal rate and regular rhythm.     Pulses: Normal pulses.     Heart sounds: Normal heart sounds.  Pulmonary:     Effort: Pulmonary effort is normal.     Breath sounds: Normal breath sounds.  Abdominal:     Palpations: Abdomen is soft.  Musculoskeletal:        General: Normal range of motion.     Cervical back: Normal range of motion and neck supple.  Lymphadenopathy:     Cervical: No cervical adenopathy.  Skin:    General: Skin is warm and dry.     Capillary Refill: Capillary refill takes less than 2 seconds.  Neurological:     General: No focal deficit present.     Mental Status: She is  alert and oriented to person, place, and time.  Psychiatric:        Mood and Affect: Mood normal.        Behavior: Behavior normal.        Thought Content: Thought content normal.        Judgment: Judgment normal.       Assessment & Plan    1. Hyperlipidemia LDL goal <100 Continue to limit intake of fried and fatty foods. Increase intake of lean proteins and green leafy vegetables. Adding exercise into daily routine will also be beneficial.    2. BMI 38.0-38.9,adult Continue phentermine additional 30 days. Continue lowering calorie intake to 1500 calories per day and incorporating exercise into daily routine to help lose weight.  - phentermine (ADIPEX-P) 37.5 MG tablet; Take 1 tablet (37.5 mg total) by mouth daily before breakfast.  Dispense: 30 tablet; Refill: 1  3. Need for influenza vaccination Flu vaccine administered during today's visit.  - Flu Vaccine QUAD 6+ mos PF IM (Fluarix Quad PF)   Problem List Items Addressed This Visit       Other   Hyperlipidemia LDL goal <100 - Primary   BMI 38.0-38.9,adult   Relevant Medications   phentermine (ADIPEX-P) 37.5 MG tablet   Other Visit Diagnoses     Need for influenza vaccination       Relevant Orders   Flu Vaccine QUAD 6+ mos PF IM (Fluarix Quad PF) (Completed)        Return in about 6 weeks (around 10/19/2022) for routine - weight management.         Carlean Jews, NP  [facility] at [facility] [phone] (phone) [phone] (fax)  [facility]

## 2022-09-25 DIAGNOSIS — Z6838 Body mass index (BMI) 38.0-38.9, adult: Secondary | ICD-10-CM | POA: Insufficient documentation

## 2022-10-26 ENCOUNTER — Ambulatory Visit (INDEPENDENT_AMBULATORY_CARE_PROVIDER_SITE_OTHER): Payer: BC Managed Care – PPO | Admitting: Nurse Practitioner

## 2022-10-26 DIAGNOSIS — Z6838 Body mass index (BMI) 38.0-38.9, adult: Secondary | ICD-10-CM | POA: Diagnosis not present

## 2022-10-26 DIAGNOSIS — R5383 Other fatigue: Secondary | ICD-10-CM

## 2022-10-26 MED ORDER — PHENTERMINE HCL 37.5 MG PO TABS: 37.5000 mg | ORAL_TABLET | Freq: Every day | ORAL | 1 refills | Status: DC

## 2022-10-26 NOTE — Progress Notes (Signed)
Established patient visit    Patient: Alexandra May   DOB: 01/13/1986   37 y.o. Female  MRN: [id number] Visit Date: 10/26/2022  Virtual Visit via Telephone Note  I connected with Alexandra May on 12/01/22 at  3:30 PM EST by telephone and verified that I am speaking with the correct person using two identifiers.  Location: Patient: home  Provider: [place] [facility]     I discussed the limitations, risks, security and privacy concerns of performing an evaluation and management service by telephone and the availability of in person appointments. I also discussed with the patient that there may be a patient responsible charge related to this service. The patient expressed understanding and agreed to proceed.       Chief Complaint  Patient presents with   Weight Check   Subjective    HPI   Follow up  Started phentermine 03/14/2022 -initial weight 03/14/2022 - 260 -most recent weight 09/07/2022 - 243 -today's weight 10/26/2022 - 243 -weight loss since most recent visit - 0  -total weight loss -  17 pounds  -side effects from taking phentermine - no negative side effects  -states that she feels medication is effective and would like to try for additional 30 days.    Medications: Outpatient Medications Prior to Visit  Medication Sig   [DISCONTINUED] phentermine (ADIPEX-P) 37.5 MG tablet Take 1 tablet (37.5 mg total) by mouth daily before breakfast.   No facility-administered medications prior to visit.    Review of Systems  Constitutional:  Negative for activity change, appetite change, chills, fatigue and fever.  HENT:  Negative for congestion, postnasal drip, rhinorrhea, sinus pressure, sinus pain, sneezing and sore throat.   Eyes: Negative.   Respiratory:  Negative for cough, chest tightness, shortness of breath and wheezing.   Cardiovascular:  Negative for chest pain and palpitations.  Gastrointestinal:  Negative for abdominal pain, constipation,  diarrhea, nausea and vomiting.  Endocrine: Negative for cold intolerance, heat intolerance, polydipsia and polyuria.  Genitourinary:  Negative for dyspareunia, dysuria, flank pain, frequency and urgency.  Musculoskeletal:  Negative for arthralgias, back pain and myalgias.  Skin:  Negative for rash.  Allergic/Immunologic: Negative for environmental allergies.  Neurological:  Negative for dizziness, weakness and headaches.  Hematological:  Negative for adenopathy.  Psychiatric/Behavioral:  The patient is not nervous/anxious.     Last CBC Lab Results  Component Value Date   WBC 5.4 03/08/2022   HGB 13.0 03/08/2022   HCT 38.7 03/08/2022   MCV 90 03/08/2022   MCH 30.4 03/08/2022   RDW 12.1 03/08/2022   PLT 245 [id number]   Last metabolic panel Lab Results  Component Value Date   GLUCOSE 87 03/08/2022   NA 138 03/08/2022   K 4.2 03/08/2022   CL 104 03/08/2022   CO2 22 03/08/2022   BUN 14 03/08/2022   CREATININE 0.84 03/08/2022   EGFR 92 03/08/2022   CALCIUM 9.2 03/08/2022   PROT 6.2 03/08/2022   ALBUMIN 4.0 03/08/2022   LABGLOB 2.2 03/08/2022   AGRATIO 1.8 03/08/2022   BILITOT 0.4 03/08/2022   ALKPHOS 44 03/08/2022   AST 19 03/08/2022   ALT 13 03/08/2022   Last lipids Lab Results  Component Value Date   CHOL 185 03/08/2022   HDL 60 03/08/2022   LDLCALC 109 (H) 03/08/2022   TRIG 90 03/08/2022   CHOLHDL 3.1 03/08/2022   Last hemoglobin A1c Lab Results  Component Value Date   HGBA1C 5.0 03/08/2022  Last thyroid functions Lab Results  Component Value Date   TSH 1.430 03/08/2022   T3TOTAL 105 01/15/2021   Last vitamin D Lab Results  Component Value Date   VD25OH 35.6 03/08/2022       Objective      BP Readings from Last 3 Encounters:  09/07/22 139/86  07/19/22 110/76  06/14/22 107/73    Wt Readings from Last 3 Encounters:  09/07/22 243 lb (110.2 kg)  07/19/22 244 lb 6.4 oz (110.9 kg)  06/14/22 250 lb 12.8 oz (113.8 kg)     The patient is  alert and oriented. She is pleasant and answers all questions appropriately. Breathing is non-labored. She is in no acute distress at this time.     Assessment & Plan    1. Other fatigue Improving. Will continue to monitor   2. BMI 38.0-38.9,adult Will continue phentermine daily. Continue with low calorie, high-protein diet. Incorporate exercise into daily activities.   - phentermine (ADIPEX-P) 37.5 MG tablet; Take 1 tablet (37.5 mg total) by mouth daily before breakfast.  Dispense: 30 tablet; Refill: 1   Problem List Items Addressed This Visit       Other   Other fatigue - Primary   BMI 38.0-38.9,adult   Relevant Medications   phentermine (ADIPEX-P) 37.5 MG tablet     I discussed the assessment and treatment plan with the patient. The patient was provided an opportunity to ask questions and all were answered. The patient agreed with the plan and demonstrated an understanding of the instructions.   The patient was advised to call back or seek an in-person evaluation if the symptoms worsen or if the condition fails to improve as anticipated.  I provided 10 minutes of non-face-to-face time during this encounter.   Ronnell Freshwater, NP  No follow-ups on file.         Ronnell Freshwater, NP  [facility] at [facility] [phone] (phone) [phone] (fax)  [place]

## 2022-10-31 LAB — HM PAP SMEAR: HPV, high-risk: NEGATIVE

## 2022-12-01 ENCOUNTER — Encounter: Payer: Self-pay | Admitting: Nurse Practitioner

## 2022-12-08 ENCOUNTER — Ambulatory Visit (INDEPENDENT_AMBULATORY_CARE_PROVIDER_SITE_OTHER): Payer: BC Managed Care – PPO | Admitting: Nurse Practitioner

## 2022-12-08 ENCOUNTER — Encounter: Payer: Self-pay | Admitting: Nurse Practitioner

## 2022-12-08 VITALS — BP 128/87 | HR 93 | Ht 66.93 in | Wt 246.6 lb

## 2022-12-08 DIAGNOSIS — Z6838 Body mass index (BMI) 38.0-38.9, adult: Secondary | ICD-10-CM | POA: Diagnosis not present

## 2022-12-08 DIAGNOSIS — R5383 Other fatigue: Secondary | ICD-10-CM

## 2022-12-08 MED ORDER — PHENDIMETRAZINE TARTRATE ER 105 MG PO CP24: 1.0000 | ORAL_CAPSULE | Freq: Every day | ORAL | 1 refills | Status: DC

## 2022-12-08 NOTE — Progress Notes (Signed)
Established patient visit   Patient: Alexandra May   DOB: 1986/06/25   37 y.o. Female  MRN: [id number] Visit Date: 12/08/2022   Chief Complaint  Patient presents with   Medical Management of Chronic Issues   Subjective    HPI  Follow up  Started phentermine 03/14/2022 -initial weight 03/14/2022 - 260 -most recent weight 10/26/2022 - 243 -today's weight 12/08/2022 - 246 -weight loss since most recent visit - 3 pound weight gai  -total weight loss -  15 pounds  -side effects from taking phentermine - no negative side effects  -states that she feels medication is effective and would like to try for additional 30 days.  -She denies chest pain, chest pressure, or shortness of breath. She denies headaches or visual disturbances. She denies abdominal pain, nausea, vomiting, or changes in bowel or bladder habits.     Medications: Outpatient Medications Prior to Visit  Medication Sig   [DISCONTINUED] phentermine (ADIPEX-P) 37.5 MG tablet Take 1 tablet (37.5 mg total) by mouth daily before breakfast.   No facility-administered medications prior to visit.    Review of Systems See HPI      Objective     Today's Vitals   12/08/22 1602  BP: 128/87  Pulse: 93  SpO2: 100%  Weight: 246 lb 9.6 oz (111.9 kg)  Height: 5' 6.93" (1.7 m)   Body mass index is 38.7 kg/m.  BP Readings from Last 3 Encounters:  12/08/22 128/87  09/07/22 139/86  07/19/22 110/76    Wt Readings from Last 3 Encounters:  12/08/22 246 lb 9.6 oz (111.9 kg)  09/07/22 243 lb (110.2 kg)  07/19/22 244 lb 6.4 oz (110.9 kg)    Physical Exam Vitals and nursing note reviewed.  Constitutional:      Appearance: Normal appearance. She is well-developed.  HENT:     Head: Normocephalic and atraumatic.     Nose: Nose normal.     Mouth/Throat:     Mouth: Mucous membranes are moist.     Pharynx: Oropharynx is clear.  Eyes:     Extraocular Movements: Extraocular movements intact.     Conjunctiva/sclera: Conjunctivae  normal.     Pupils: Pupils are equal, round, and reactive to light.  Neck:     Vascular: No carotid bruit.  Cardiovascular:     Rate and Rhythm: Normal rate and regular rhythm.     Pulses: Normal pulses.     Heart sounds: Normal heart sounds.  Pulmonary:     Effort: Pulmonary effort is normal.     Breath sounds: Normal breath sounds.  Abdominal:     Palpations: Abdomen is soft.  Musculoskeletal:        General: Normal range of motion.     Cervical back: Normal range of motion and neck supple.  Lymphadenopathy:     Cervical: No cervical adenopathy.  Skin:    General: Skin is warm and dry.     Capillary Refill: Capillary refill takes less than 2 seconds.  Neurological:     General: No focal deficit present.     Mental Status: She is alert and oriented to person, place, and time.  Psychiatric:        Mood and Affect: Mood normal.        Behavior: Behavior normal.        Thought Content: Thought content normal.        Judgment: Judgment normal.       Assessment & Plan    Other fatigue  Assessment & Plan: Improving with recent weight loss. Will monitor.    BMI 38.0-38.9,adult Assessment & Plan: Trial Bontril 105 mg  daily. Continue with low calorie, high-protein diet. Incorporate exercise into daily activities.    Orders: -     Phendimetrazine Tartrate ER; Take 1 capsule (105 mg total) by mouth daily.  Dispense: 30 capsule; Refill: 1      Return in about 6 weeks (around 01/19/2023) for routine - weight management.         Ronnell Freshwater, NP  [facility] at [facility] [phone] (phone) [phone] (fax)  [place]

## 2022-12-21 ENCOUNTER — Telehealth: Payer: Self-pay | Admitting: *Deleted

## 2022-12-21 NOTE — Telephone Encounter (Signed)
LVM to call office back, returning call for VM left on machine yesterday.

## 2022-12-22 ENCOUNTER — Telehealth: Payer: Self-pay

## 2022-12-22 NOTE — Telephone Encounter (Signed)
Called pt she is advised of that her Rx was denied and she can go through [facility] to get a small discount pt was agreeable.

## 2023-01-08 NOTE — Assessment & Plan Note (Signed)
Improving with recent weight loss. Will monitor.

## 2023-01-08 NOTE — Assessment & Plan Note (Signed)
Trial Bontril 105 mg  daily. Continue with low calorie, high-protein diet. Incorporate exercise into daily activities.

## 2023-01-19 ENCOUNTER — Ambulatory Visit: Payer: BC Managed Care – PPO | Admitting: Nurse Practitioner

## 2023-02-07 ENCOUNTER — Ambulatory Visit: Payer: BC Managed Care – PPO | Admitting: Nurse Practitioner

## 2023-02-07 ENCOUNTER — Encounter: Payer: Self-pay | Admitting: Nurse Practitioner

## 2023-02-07 VITALS — BP 116/79 | HR 76 | Ht 66.93 in | Wt 256.8 lb

## 2023-02-07 DIAGNOSIS — R5383 Other fatigue: Secondary | ICD-10-CM

## 2023-02-07 DIAGNOSIS — Z6841 Body Mass Index (BMI) 40.0 and over, adult: Secondary | ICD-10-CM

## 2023-02-07 MED ORDER — WEGOVY 0.25 MG/0.5ML ~~LOC~~ SOAJ: 0.2500 mg | SUBCUTANEOUS | 1 refills | Status: DC

## 2023-02-07 NOTE — Progress Notes (Signed)
Established patient visit   Patient: Alexandra May   DOB: 03-24-1986   37 y.o. Female  MRN: [id number] Visit Date: 02/07/2023   Chief Complaint  Patient presents with   Weight Check   Subjective    HPI  Follow up  Started phentermine 03/14/2022 -initial weight 03/14/2022 - 260 -most recent weight 12/08/2022 - 246 -today's weight 02/07/2023 - 256 -weight loss since most recent visit - 10 pound weight gai  -total weight loss -  5 pounds  -side effects from taking phentermine - no negative side effects but does not feel as though this medication is working for her any longer.    Medications: Outpatient Medications Prior to Visit  Medication Sig   Phendimetrazine Tartrate 105 MG CP24 Take 1 capsule (105 mg total) by mouth daily.   No facility-administered medications prior to visit.    Review of Systems See HPI    Last CBC Lab Results  Component Value Date   WBC 5.4 03/08/2022   HGB 13.0 03/08/2022   HCT 38.7 03/08/2022   MCV 90 03/08/2022   MCH 30.4 03/08/2022   RDW 12.1 03/08/2022   PLT 245 03/08/2022   Last metabolic panel Lab Results  Component Value Date   GLUCOSE 87 03/08/2022   NA 138 03/08/2022   K 4.2 03/08/2022   CL 104 03/08/2022   CO2 22 03/08/2022   BUN 14 03/08/2022   CREATININE 0.84 03/08/2022   EGFR 92 03/08/2022   CALCIUM 9.2 03/08/2022   PROT 6.2 03/08/2022   ALBUMIN 4.0 03/08/2022   LABGLOB 2.2 03/08/2022   AGRATIO 1.8 03/08/2022   BILITOT 0.4 03/08/2022   ALKPHOS 44 03/08/2022   AST 19 03/08/2022   ALT 13 03/08/2022   Last lipids Lab Results  Component Value Date   CHOL 185 03/08/2022   HDL 60 03/08/2022   LDLCALC 109 (H) 03/08/2022   TRIG 90 03/08/2022   CHOLHDL 3.1 03/08/2022   Last hemoglobin A1c Lab Results  Component Value Date   HGBA1C 5.0 03/08/2022   Last thyroid functions Lab Results  Component Value Date   TSH 1.430 03/08/2022   T3TOTAL 105 01/15/2021   Last vitamin D Lab Results  Component Value Date   VD25OH  35.6 03/08/2022       Objective     Today's Vitals   02/07/23 1540  BP: 116/79  Pulse: 76  SpO2: 100%  Weight: 256 lb 12.8 oz (116.5 kg)  Height: 5' 6.93" (1.7 m)   Body mass index is 40.3 kg/m.  BP Readings from Last 3 Encounters:  02/07/23 116/79  12/08/22 128/87  09/07/22 139/86    Wt Readings from Last 3 Encounters:  02/07/23 256 lb 12.8 oz (116.5 kg)  12/08/22 246 lb 9.6 oz (111.9 kg)  09/07/22 243 lb (110.2 kg)    Physical Exam Vitals and nursing note reviewed.  Constitutional:      Appearance: Normal appearance. She is well-developed. She is obese.  HENT:     Head: Normocephalic and atraumatic.     Nose: Nose normal.     Mouth/Throat:     Mouth: Mucous membranes are moist.     Pharynx: Oropharynx is clear.  Eyes:     Extraocular Movements: Extraocular movements intact.     Conjunctiva/sclera: Conjunctivae normal.     Pupils: Pupils are equal, round, and reactive to light.  Neck:     Vascular: No carotid bruit.  Cardiovascular:     Rate and Rhythm: Normal rate and regular  rhythm.     Pulses: Normal pulses.     Heart sounds: Normal heart sounds.  Pulmonary:     Effort: Pulmonary effort is normal.     Breath sounds: Normal breath sounds.  Abdominal:     Palpations: Abdomen is soft.  Musculoskeletal:        General: Normal range of motion.     Cervical back: Normal range of motion and neck supple.  Lymphadenopathy:     Cervical: No cervical adenopathy.  Skin:    General: Skin is warm and dry.     Capillary Refill: Capillary refill takes less than 2 seconds.  Neurological:     General: No focal deficit present.     Mental Status: She is alert and oriented to person, place, and time.  Psychiatric:        Mood and Affect: Mood normal.        Behavior: Behavior normal.        Thought Content: Thought content normal.        Judgment: Judgment normal.      Assessment & Plan    Other fatigue Assessment & Plan: Recently worse with weight  gain   Class 3 obesity (HCC) Assessment & Plan: Trial Wegovy 2.5 mg weekly  -if effective without negative side effects for first 3 to 4 weeks, will increase to 5 mg  dose  -Continue with low calorie, high-protein diet. Incorporate exercise into daily activities.   -reassess in 2 months       Return in about 2 months (around 04/09/2023) for routine - weight management. started [employer] .         Carlean Jews, NP  [facility] at [facility] [phone] (phone) [phone] (fax)  [facility]

## 2023-02-08 ENCOUNTER — Ambulatory Visit: Payer: BC Managed Care – PPO | Admitting: Nurse Practitioner

## 2023-02-08 ENCOUNTER — Encounter: Payer: Self-pay | Admitting: Nurse Practitioner

## 2023-02-08 ENCOUNTER — Other Ambulatory Visit: Payer: Self-pay | Admitting: Nurse Practitioner

## 2023-02-08 DIAGNOSIS — Z6841 Body Mass Index (BMI) 40.0 and over, adult: Secondary | ICD-10-CM

## 2023-02-08 MED ORDER — WEGOVY 0.25 MG/0.5ML ~~LOC~~ SOAJ: 0.2500 mg | SUBCUTANEOUS | 1 refills | Status: DC

## 2023-02-15 ENCOUNTER — Encounter: Payer: Self-pay | Admitting: Nurse Practitioner

## 2023-02-20 ENCOUNTER — Encounter: Payer: Self-pay | Admitting: Nurse Practitioner

## 2023-02-26 DIAGNOSIS — E66813 Obesity, class 3: Secondary | ICD-10-CM | POA: Insufficient documentation

## 2023-02-26 NOTE — Assessment & Plan Note (Signed)
Recently worse with weight gain

## 2023-02-26 NOTE — Assessment & Plan Note (Signed)
Trial Wegovy 2.5 mg weekly  -if effective without negative side effects for first 3 to 4 weeks, will increase to 5 mg  dose  -Continue with low calorie, high-protein diet. Incorporate exercise into daily activities.   -reassess in 2 months

## 2023-04-24 ENCOUNTER — Ambulatory Visit: Payer: BC Managed Care – PPO | Admitting: Nurse Practitioner

## 2023-05-08 ENCOUNTER — Ambulatory Visit: Payer: BC Managed Care – PPO | Admitting: Family Medicine

## 2023-05-08 ENCOUNTER — Encounter: Payer: Self-pay | Admitting: Family Medicine

## 2023-05-08 VITALS — BP 113/79 | HR 70

## 2023-05-08 MED ORDER — CONTRAVE 8-90 MG PO TB12: ORAL_TABLET | ORAL | 0 refills | Status: DC

## 2023-05-08 NOTE — Progress Notes (Unsigned)
   Established Patient Office Visit  Subjective   Patient ID: Alexandra May, female    DOB: [birth year]  Age: 37 y.o. MRN: [id number]  No chief complaint on file.   HPI  Weight-Wegovy?.  Healthy weight and wellness? Wegovy - never got it.    Cuts carbs and sugars.    [facility] in [place]    The ASCVD Risk score (Arnett DK, et al., 2019) failed to calculate for the following reasons:   The 2019 ASCVD risk score is only valid for ages 44 to 14   {History (Optional):23778}  ROS    Objective:     There were no vitals taken for this visit. {Vitals History (Optional):23777}  Physical Exam   No results found for any visits on 05/08/23.  {Labs (Optional):23779}      Assessment & Plan:   There are no diagnoses linked to this encounter.   No follow-ups on file.    Sandre Kitty, MD

## 2023-05-08 NOTE — Progress Notes (Deleted)
   Established Patient Office Visit  Subjective   Patient ID: Alexandra May, female    DOB: 05-02-1986  Age: 37 y.o. MRN: [id number]  No chief complaint on file.   HPI  Weight-Wegovy?.  Healthy weight and wellness?   The ASCVD Risk score (Arnett DK, et al., 2019) failed to calculate for the following reasons:   The 2019 ASCVD risk score is only valid for ages 38 to 44   {History (Optional):23778}  ROS    Objective:     There were no vitals taken for this visit. {Vitals History (Optional):23777}  Physical Exam   No results found for any visits on 05/08/23.  {Labs (Optional):23779}      Assessment & Plan:   There are no diagnoses linked to this encounter.   No follow-ups on file.    Sandre Kitty, MD

## 2023-05-08 NOTE — Patient Instructions (Signed)
It was nice to see you today,  We addressed the following topics today: - I have sent in a prescription for contraveIt was nice to see you today, - the website is [email or web address] - if you have any issues with this I can always prescribe the medication you were previously taking.   Have a great day,  Frederic Jericho, MD

## 2023-05-09 NOTE — Assessment & Plan Note (Addendum)
Wegovy unaffordable.  Contrave may also be unaffordable.  Patient looking into affordability options for this.  If Contrave not an option patient will go back on her previous medication.  Did show some weight loss at that time.  Discussed diet options including strict adherence to Mediterranean diet or calorie counting as a way of losing weight.  Prescription sent in for Contrave.  Patient given information on manufactures coupon.

## 2023-05-17 ENCOUNTER — Encounter: Payer: Self-pay | Admitting: Family Medicine

## 2023-05-18 ENCOUNTER — Other Ambulatory Visit: Payer: Self-pay | Admitting: Family Medicine

## 2023-05-18 DIAGNOSIS — Z6838 Body mass index (BMI) 38.0-38.9, adult: Secondary | ICD-10-CM

## 2023-05-18 MED ORDER — PHENDIMETRAZINE TARTRATE ER 105 MG PO CP24: 1.0000 | ORAL_CAPSULE | Freq: Every day | ORAL | 1 refills | Status: DC

## 2023-07-12 ENCOUNTER — Ambulatory Visit: Payer: BC Managed Care – PPO | Admitting: Family Medicine

## 2023-10-30 DIAGNOSIS — N951 Menopausal and female climacteric states: Secondary | ICD-10-CM | POA: Insufficient documentation

## 2023-12-12 DIAGNOSIS — E28319 Asymptomatic premature menopause: Secondary | ICD-10-CM | POA: Insufficient documentation

## 2024-03-19 ENCOUNTER — Ambulatory Visit: Payer: Self-pay | Admitting: Podiatry

## 2024-03-21 ENCOUNTER — Encounter: Payer: Self-pay | Admitting: Podiatry

## 2024-03-21 ENCOUNTER — Ambulatory Visit (INDEPENDENT_AMBULATORY_CARE_PROVIDER_SITE_OTHER)

## 2024-03-21 ENCOUNTER — Ambulatory Visit: Payer: Self-pay | Admitting: Podiatry

## 2024-03-21 DIAGNOSIS — M722 Plantar fascial fibromatosis: Secondary | ICD-10-CM | POA: Diagnosis not present

## 2024-03-21 DIAGNOSIS — M7752 Other enthesopathy of left foot: Secondary | ICD-10-CM | POA: Diagnosis not present

## 2024-03-21 DIAGNOSIS — O365990 Maternal care for other known or suspected poor fetal growth, unspecified trimester, not applicable or unspecified: Secondary | ICD-10-CM | POA: Insufficient documentation

## 2024-03-21 MED ORDER — TRIAMCINOLONE ACETONIDE 40 MG/ML IJ SUSP
40.0000 mg | Freq: Once | INTRAMUSCULAR | Status: AC
  Administered 2024-03-21: 40 mg

## 2024-03-21 MED ORDER — METHYLPREDNISOLONE 4 MG PO TBPK: ORAL_TABLET | ORAL | 0 refills | Status: DC

## 2024-03-21 MED ORDER — CELECOXIB 200 MG PO CAPS: 200.0000 mg | ORAL_CAPSULE | Freq: Two times a day (BID) | ORAL | 3 refills | Status: AC

## 2024-03-21 NOTE — Progress Notes (Signed)
 She presents today after having not seen her for almost 3 years with a chief concern of a flare to the plantar fasciitis of the right foot as well as to the left some degree.  She feels that she may have stepped wrong on the left foot which is resulting in pain across the dorsal lateral aspect of the foot.  She states has been a aching since October 2024.  Objective: Vital signs are stable alert oriented x 3 pulses are palpable.  Neurologic sensorium is intact Deetjen reflexes are intact muscle strength is normal and symmetrical bilateral.  She does have significant pain on palpation medial calcaneal tubercles bilateral show she has pain on palpation of the fourth fifth tarsometatarsal joint articulation.  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  Plan: Discussed etiology pathology and surgical therapies start her back on methylprednisolone  to be followed by Celebrex  200 mg twice daily.  Also reinjected the bilateral heels today 20 mg Kenalog  5 mg Marcaine  to the point of maximal tenderness.  Tolerated procedure well without complications.

## 2024-04-23 ENCOUNTER — Ambulatory Visit: Admitting: Podiatry

## 2024-09-24 ENCOUNTER — Ambulatory Visit: Admitting: Podiatry

## 2024-09-24 ENCOUNTER — Encounter: Payer: Self-pay | Admitting: Podiatry

## 2024-09-24 DIAGNOSIS — M722 Plantar fascial fibromatosis: Secondary | ICD-10-CM

## 2024-09-24 MED ORDER — TRIAMCINOLONE ACETONIDE 40 MG/ML IJ SUSP
40.0000 mg | Freq: Once | INTRAMUSCULAR | Status: AC
  Administered 2024-09-24: 17:00:00 40 mg

## 2024-09-24 NOTE — Progress Notes (Signed)
 She presents today for follow-up of her plantar fasciitis.  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  She states that she was doing well but the fasciitis just slowly started to come back.  Objective: Vital signs stable oriented x 3 pulses are palpable.  Has pain on palpation medial cannular tubercle bilateral.  Lateral compensatory pain with capsulitis fourth fifth tarsometatarsal joint.  Assessment: Plantar fasciitis lateral compensatory syndrome left.  Plan: Injected the bilateral heels today 20 mg Kenalog  5 mg Marcaine  continue Celebrex .  Follow-up with her in a few weeks but I am suggesting orthotics.

## 2024-10-22 ENCOUNTER — Ambulatory Visit: Payer: Self-pay | Admitting: Family Medicine

## 2024-10-22 ENCOUNTER — Encounter: Payer: Self-pay | Admitting: Family Medicine

## 2024-10-22 VITALS — BP 114/73 | HR 100 | Ht 66.93 in | Wt 286.0 lb

## 2024-10-22 DIAGNOSIS — E559 Vitamin D deficiency, unspecified: Secondary | ICD-10-CM

## 2024-10-22 DIAGNOSIS — E785 Hyperlipidemia, unspecified: Secondary | ICD-10-CM

## 2024-10-22 DIAGNOSIS — Z6841 Body Mass Index (BMI) 40.0 and over, adult: Secondary | ICD-10-CM

## 2024-10-22 DIAGNOSIS — I10 Essential (primary) hypertension: Secondary | ICD-10-CM

## 2024-10-22 NOTE — Patient Instructions (Signed)
" °  VISIT SUMMARY: Today we discussed your weight management and medication review following menopause. We also addressed your plantar fasciitis and overdue routine health maintenance.  YOUR PLAN: OBESITY: Your weight management is complicated by insurance coverage and medication tolerance. -Contact [employer] to ask them specifically what medications the cover for the diagnosis of obesity, including zepbound, wegovy , contrave .   - ask them if there are any restrictions or requirements for coverage such as participating in a weight loss program.  -I will send in a referral to the [facility] clinic. -Look into [facility] for online nutrition counseling. -Schedule a follow-up appointment in three months for an exam and weight management review.   "

## 2024-10-22 NOTE — Assessment & Plan Note (Signed)
 Management complicated by insurance coverage and medication tolerance. Discussed potential medications and emphasized caloric balance. Considered weight loss program and specialist referral. - Contact [employer] for coverage on Wegovy , Zepbound, and other medications. - Consider referral to [facility] clinic. - Provided information on [employer] [facility] for online nutrition counseling. - Printed article on diet and weight loss. - Schedule follow-up in three months for exam and weight management review.

## 2024-10-22 NOTE — Progress Notes (Unsigned)
" ° °  Established Patient Office Visit  Subjective   Patient ID: Alexandra May, female    DOB: 1986/06/08  Age: 39 y.o. MRN: [id number]  Chief Complaint  Patient presents with   Medical Management of Chronic Issues    Discussed the use of AI scribe software for clinical note transcription with the patient, who gave verbal consent to proceed.  History of Present Illness   Alexandra May is a 39 year old female who presents for weight management and medication review following menopause.  She completed menopause and started low-dose hormone therapy, after which she noticed significant weight gain, which she also relates to recent job-related stress.  She has used phentermine  and phendimetrazine  in the past for weight management without reported intolerance. She has not used Contrave  or injectable options like Wegovy  because of insurance coverage limitations with her current [place] plan.  She is trying to reduce dietary carbohydrates but is not tracking carbohydrates or calories and has not seen meaningful weight loss. Her physical activity is limited by a bone spur and plantar fasciitis.  She has not had laboratory testing in about two years.          The 10-year ASCVD risk score (Arnett DK, et al., 2019) is: 0.3%  Health Maintenance Due  Topic Date Due   HIV Screening  Never done   Hepatitis B Vaccines 19-59 Average Risk (2 of 3 - 3-dose series) 10/08/1999   Hepatitis C Screening  Never done   HPV VACCINES (1 - 3-dose SCDM series) Never done   DTaP/Tdap/Td (2 - Td or Tdap) 11/04/2023   COVID-19 Vaccine (1 - 2025-26 season) Never done   Mammogram  Never done      Objective:     BP 114/73   Pulse 100   Ht 5' 6.93 (1.7 m)   Wt 286 lb (129.7 kg)   SpO2 100%   BMI 44.89 kg/m  {Vitals History (Optional):23777}  Physical Exam     Gen: alert, oriented Pulm: no respiratory distress Psych: pleasant affect       No results found for any visits on 10/22/24.       Assessment & Plan:   BMI 40.0-44.9, adult [facility]) Assessment & Plan: Management complicated by insurance coverage and medication tolerance. Discussed potential medications and emphasized caloric balance. Considered weight loss program and specialist referral. - Contact [employer] for coverage on Wegovy , Zepbound, and other medications. - Consider referral to [facility] Wellness clinic. - Provided information on [employer] [facility] for online nutrition counseling. - Printed article on diet and weight loss. - Schedule follow-up in three months for exam and weight management review.         Return in about 2 months (around 12/20/2024) for physical.    Toribio MARLA Slain, MD  "

## 2024-10-23 ENCOUNTER — Other Ambulatory Visit: Payer: Self-pay

## 2024-10-23 DIAGNOSIS — E785 Hyperlipidemia, unspecified: Secondary | ICD-10-CM

## 2024-10-23 DIAGNOSIS — E559 Vitamin D deficiency, unspecified: Secondary | ICD-10-CM

## 2024-10-23 DIAGNOSIS — I10 Essential (primary) hypertension: Secondary | ICD-10-CM

## 2024-10-23 DIAGNOSIS — Z6841 Body Mass Index (BMI) 40.0 and over, adult: Secondary | ICD-10-CM

## 2024-10-23 NOTE — Addendum Note (Signed)
 Addended by: Davida Falconi, DAN K on: 10/23/2024 08:04 AM   Modules accepted: Orders

## 2024-10-24 LAB — CBC WITH DIFFERENTIAL/PLATELET
Basophils Absolute: 0.1 x10E3/uL (ref 0.0–0.2)
Basos: 1 %
EOS (ABSOLUTE): 0.2 x10E3/uL (ref 0.0–0.4)
Eos: 4 %
Hematocrit: 39.6 % (ref 34.0–46.6)
Hemoglobin: 13.1 g/dL (ref 11.1–15.9)
Immature Grans (Abs): 0 x10E3/uL (ref 0.0–0.1)
Immature Granulocytes: 0 %
Lymphocytes Absolute: 1.5 x10E3/uL (ref 0.7–3.1)
Lymphs: 38 %
MCH: 30.5 pg (ref 26.6–33.0)
MCHC: 33.1 g/dL (ref 31.5–35.7)
MCV: 92 fL (ref 79–97)
Monocytes Absolute: 0.4 x10E3/uL (ref 0.1–0.9)
Monocytes: 9 %
Neutrophils Absolute: 1.9 x10E3/uL (ref 1.4–7.0)
Neutrophils: 48 %
Platelets: 229 x10E3/uL (ref 150–450)
RBC: 4.3 x10E6/uL (ref 3.77–5.28)
RDW: 12.5 % (ref 11.7–15.4)
WBC: 4 x10E3/uL (ref 3.4–10.8)

## 2024-10-24 LAB — COMPREHENSIVE METABOLIC PANEL WITH GFR
ALT: 18 IU/L (ref 0–32)
AST: 19 IU/L (ref 0–40)
Albumin: 4.2 g/dL (ref 3.9–4.9)
Alkaline Phosphatase: 67 IU/L (ref 41–116)
BUN/Creatinine Ratio: 14 (ref 9–23)
BUN: 13 mg/dL (ref 6–24)
Bilirubin Total: 0.5 mg/dL (ref 0.0–1.2)
CO2: 22 mmol/L (ref 20–29)
Calcium: 9.2 mg/dL (ref 8.7–10.2)
Chloride: 104 mmol/L (ref 96–106)
Creatinine, Ser: 0.91 mg/dL (ref 0.57–1.00)
Globulin, Total: 2.5 g/dL (ref 1.5–4.5)
Glucose: 88 mg/dL (ref 70–99)
Potassium: 4.5 mmol/L (ref 3.5–5.2)
Sodium: 141 mmol/L (ref 134–144)
Total Protein: 6.7 g/dL (ref 6.0–8.5)
eGFR: 82 mL/min/1.73

## 2024-10-24 LAB — LIPID PANEL
Chol/HDL Ratio: 2.8 ratio (ref 0.0–4.4)
Cholesterol, Total: 192 mg/dL (ref 100–199)
HDL: 69 mg/dL
LDL Chol Calc (NIH): 113 mg/dL — ABNORMAL HIGH (ref 0–99)
Triglycerides: 50 mg/dL (ref 0–149)
VLDL Cholesterol Cal: 10 mg/dL (ref 5–40)

## 2024-10-24 LAB — HEMOGLOBIN A1C
Est. average glucose Bld gHb Est-mCnc: 100 mg/dL
Hgb A1c MFr Bld: 5.1 % (ref 4.8–5.6)

## 2024-10-24 LAB — TSH: TSH: 0.984 u[IU]/mL (ref 0.450–4.500)

## 2024-10-24 LAB — VITAMIN D 25 HYDROXY (VIT D DEFICIENCY, FRACTURES): Vit D, 25-Hydroxy: 32.6 ng/mL (ref 30.0–100.0)

## 2024-10-25 ENCOUNTER — Ambulatory Visit: Payer: Self-pay | Admitting: Family Medicine

## 2024-10-30 ENCOUNTER — Encounter: Payer: Self-pay | Admitting: Family Medicine

## 2024-10-31 ENCOUNTER — Other Ambulatory Visit: Payer: Self-pay | Admitting: Family Medicine

## 2024-10-31 MED ORDER — WEGOVY 0.25 MG/0.5ML ~~LOC~~ SOAJ: 0.2500 mg | SUBCUTANEOUS | 0 refills | Status: AC

## 2024-12-25 ENCOUNTER — Encounter: Payer: Self-pay | Admitting: Family Medicine
